# Patient Record
Sex: Female | Born: 1975 | Hispanic: Yes | Marital: Single | Smoking: Never smoker
Health system: Southern US, Community
[De-identification: ages and names within clinical notes are randomized; demographics above are authoritative.]

## PROBLEM LIST (undated history)

## (undated) DIAGNOSIS — F329 Major depressive disorder, single episode, unspecified: Secondary | ICD-10-CM

## (undated) DIAGNOSIS — F32A Depression, unspecified: Secondary | ICD-10-CM

## (undated) DIAGNOSIS — Z789 Other specified health status: Secondary | ICD-10-CM

## (undated) DIAGNOSIS — R42 Dizziness and giddiness: Secondary | ICD-10-CM

## (undated) DIAGNOSIS — F99 Mental disorder, not otherwise specified: Secondary | ICD-10-CM

## (undated) DIAGNOSIS — E7800 Pure hypercholesterolemia, unspecified: Secondary | ICD-10-CM

## (undated) DIAGNOSIS — O24419 Gestational diabetes mellitus in pregnancy, unspecified control: Secondary | ICD-10-CM

## (undated) HISTORY — DX: Dizziness and giddiness: R42

## (undated) HISTORY — PX: CHOLECYSTECTOMY: SHX55

## (undated) HISTORY — PX: NO PAST SURGERIES: SHX2092

---

## 2011-07-18 ENCOUNTER — Encounter: Payer: Self-pay | Admitting: *Deleted

## 2011-10-24 ENCOUNTER — Other Ambulatory Visit (HOSPITAL_COMMUNITY): Payer: Self-pay | Admitting: Physician Assistant

## 2011-10-24 DIAGNOSIS — Z3689 Encounter for other specified antenatal screening: Secondary | ICD-10-CM

## 2011-10-24 LAB — HEMOGLOBINOPATHY EVALUATION: Hemoglobin Evaluation: NORMAL

## 2011-10-24 LAB — OB RESULTS CONSOLE ANTIBODY SCREEN: Antibody Screen: NEGATIVE

## 2011-11-11 ENCOUNTER — Encounter (HOSPITAL_COMMUNITY): Payer: Self-pay

## 2011-11-11 ENCOUNTER — Ambulatory Visit (HOSPITAL_COMMUNITY)
Admission: RE | Admit: 2011-11-11 | Discharge: 2011-11-11 | Disposition: A | Discharge status: Home / Self Care | Payer: Medicaid Other | Source: Ambulatory Visit | Attending: Physician Assistant | Admitting: Physician Assistant

## 2011-11-11 DIAGNOSIS — Z3689 Encounter for other specified antenatal screening: Secondary | ICD-10-CM | POA: Insufficient documentation

## 2012-03-22 ENCOUNTER — Encounter (HOSPITAL_COMMUNITY): Payer: Self-pay | Admitting: *Deleted

## 2012-03-23 ENCOUNTER — Encounter (HOSPITAL_COMMUNITY): Payer: Self-pay | Admitting: *Deleted

## 2012-03-23 ENCOUNTER — Inpatient Hospital Stay (HOSPITAL_COMMUNITY): Payer: Medicaid Other | Admitting: Anesthesiology

## 2012-03-23 ENCOUNTER — Encounter (HOSPITAL_COMMUNITY)
Admission: RE | Disposition: A | Discharge status: Home / Self Care | Payer: Self-pay | Source: Ambulatory Visit | Attending: Obstetrics and Gynecology

## 2012-03-23 ENCOUNTER — Encounter (HOSPITAL_COMMUNITY): Payer: Self-pay | Admitting: Anesthesiology

## 2012-03-23 ENCOUNTER — Inpatient Hospital Stay (HOSPITAL_COMMUNITY)
Admission: RE | Admit: 2012-03-23 | Discharge: 2012-03-25 | DRG: 766 | Disposition: A | Discharge status: Home / Self Care | Payer: Medicaid Other | Source: Ambulatory Visit | Attending: Obstetrics and Gynecology | Admitting: Obstetrics and Gynecology

## 2012-03-23 ENCOUNTER — Other Ambulatory Visit: Payer: Self-pay | Admitting: Obstetrics & Gynecology

## 2012-03-23 DIAGNOSIS — Z98891 History of uterine scar from previous surgery: Secondary | ICD-10-CM

## 2012-03-23 DIAGNOSIS — O34219 Maternal care for unspecified type scar from previous cesarean delivery: Secondary | ICD-10-CM

## 2012-03-23 DIAGNOSIS — O09529 Supervision of elderly multigravida, unspecified trimester: Secondary | ICD-10-CM

## 2012-03-23 HISTORY — DX: Mental disorder, not otherwise specified: F99

## 2012-03-23 HISTORY — DX: Other specified health status: Z78.9

## 2012-03-23 LAB — CBC
Hemoglobin: 13.2 g/dL (ref 12.0–15.0)
MCH: 29.2 pg (ref 26.0–34.0)
MCV: 87.4 fL (ref 78.0–100.0)
RBC: 4.52 MIL/uL (ref 3.87–5.11)

## 2012-03-23 LAB — RPR: RPR Ser Ql: NONREACTIVE

## 2012-03-23 SURGERY — Surgical Case
Anesthesia: Spinal | Site: Abdomen | Wound class: Clean Contaminated

## 2012-03-23 MED ORDER — IBUPROFEN 600 MG PO TABS: 600.0000 mg | ORAL_TABLET | Freq: Four times a day (QID) | ORAL | Status: DC | PRN

## 2012-03-23 MED ORDER — ZOLPIDEM TARTRATE 5 MG PO TABS: 5.0000 mg | ORAL_TABLET | Freq: Every evening | ORAL | Status: DC | PRN

## 2012-03-23 MED ORDER — OXYTOCIN 10 UNIT/ML IJ SOLN
40.0000 [IU] | INTRAVENOUS | Status: DC | PRN
  Administered 2012-03-23: 40 [IU] via INTRAVENOUS

## 2012-03-23 MED ORDER — NALBUPHINE HCL 10 MG/ML IJ SOLN
5.0000 mg | INTRAMUSCULAR | Status: DC | PRN
  Filled 2012-03-23: qty 1

## 2012-03-23 MED ORDER — METOCLOPRAMIDE HCL 5 MG/ML IJ SOLN: 10.0000 mg | Freq: Three times a day (TID) | INTRAMUSCULAR | Status: DC | PRN

## 2012-03-23 MED ORDER — NALBUPHINE HCL 10 MG/ML IJ SOLN
5.0000 mg | INTRAMUSCULAR | Status: DC | PRN
  Administered 2012-03-23: 5 mg via SUBCUTANEOUS
  Filled 2012-03-23: qty 1

## 2012-03-23 MED ORDER — 0.9 % SODIUM CHLORIDE (POUR BTL) OPTIME
TOPICAL | Status: DC | PRN
  Administered 2012-03-23: 1000 mL

## 2012-03-23 MED ORDER — HYDROMORPHONE HCL PF 1 MG/ML IJ SOLN
INTRAMUSCULAR | Status: AC
  Administered 2012-03-23: 0.5 mg via INTRAVENOUS
  Filled 2012-03-23: qty 1

## 2012-03-23 MED ORDER — PRENATAL MULTIVITAMIN CH
1.0000 | ORAL_TABLET | Freq: Every day | ORAL | Status: DC
  Administered 2012-03-24 – 2012-03-25 (×2): 1 via ORAL
  Filled 2012-03-23 (×2): qty 1

## 2012-03-23 MED ORDER — TETANUS-DIPHTH-ACELL PERTUSSIS 5-2.5-18.5 LF-MCG/0.5 IM SUSP: 0.5000 mL | Freq: Once | INTRAMUSCULAR | Status: DC

## 2012-03-23 MED ORDER — MEPERIDINE HCL 25 MG/ML IJ SOLN: 6.2500 mg | INTRAMUSCULAR | Status: DC | PRN

## 2012-03-23 MED ORDER — SODIUM CHLORIDE 0.9 % IJ SOLN: 3.0000 mL | INTRAMUSCULAR | Status: DC | PRN

## 2012-03-23 MED ORDER — DIPHENHYDRAMINE HCL 25 MG PO CAPS: 25.0000 mg | ORAL_CAPSULE | Freq: Four times a day (QID) | ORAL | Status: DC | PRN

## 2012-03-23 MED ORDER — SIMETHICONE 80 MG PO CHEW: 80.0000 mg | CHEWABLE_TABLET | Freq: Three times a day (TID) | ORAL | Status: DC

## 2012-03-23 MED ORDER — FENTANYL CITRATE 0.05 MG/ML IJ SOLN
INTRAMUSCULAR | Status: DC | PRN
  Administered 2012-03-23: 25 ug via INTRAVENOUS

## 2012-03-23 MED ORDER — CEFAZOLIN SODIUM-DEXTROSE 2-3 GM-% IV SOLR
INTRAVENOUS | Status: DC | PRN
  Administered 2012-03-23: 2 g via INTRAVENOUS

## 2012-03-23 MED ORDER — DIPHENHYDRAMINE HCL 25 MG PO CAPS: 25.0000 mg | ORAL_CAPSULE | ORAL | Status: DC | PRN

## 2012-03-23 MED ORDER — ONDANSETRON HCL 4 MG PO TABS: 4.0000 mg | ORAL_TABLET | ORAL | Status: DC | PRN

## 2012-03-23 MED ORDER — OXYTOCIN 40 UNITS IN LACTATED RINGERS INFUSION - SIMPLE MED: 62.5000 mL/h | INTRAVENOUS | Status: DC

## 2012-03-23 MED ORDER — ONDANSETRON HCL 4 MG/2ML IJ SOLN: 4.0000 mg | INTRAMUSCULAR | Status: DC | PRN

## 2012-03-23 MED ORDER — KETOROLAC TROMETHAMINE 60 MG/2ML IM SOLN
60.0000 mg | Freq: Once | INTRAMUSCULAR | Status: AC | PRN
  Administered 2012-03-23: 60 mg via INTRAMUSCULAR

## 2012-03-23 MED ORDER — OXYCODONE-ACETAMINOPHEN 5-325 MG PO TABS: 1.0000 | ORAL_TABLET | ORAL | Status: DC | PRN

## 2012-03-23 MED ORDER — DIBUCAINE 1 % RE OINT: 1.0000 "application " | TOPICAL_OINTMENT | RECTAL | Status: DC | PRN

## 2012-03-23 MED ORDER — MORPHINE SULFATE 0.5 MG/ML IJ SOLN
INTRAMUSCULAR | Status: AC
  Filled 2012-03-23: qty 10

## 2012-03-23 MED ORDER — LACTATED RINGERS IV SOLN: INTRAVENOUS | Status: DC

## 2012-03-23 MED ORDER — HYDROMORPHONE HCL PF 1 MG/ML IJ SOLN
0.2500 mg | INTRAMUSCULAR | Status: DC | PRN
  Administered 2012-03-23 (×2): 0.5 mg via INTRAVENOUS

## 2012-03-23 MED ORDER — ONDANSETRON HCL 4 MG/2ML IJ SOLN
INTRAMUSCULAR | Status: DC | PRN
  Administered 2012-03-23: 4 mg via INTRAVENOUS

## 2012-03-23 MED ORDER — LANOLIN HYDROUS EX OINT: 1.0000 "application " | TOPICAL_OINTMENT | CUTANEOUS | Status: DC | PRN

## 2012-03-23 MED ORDER — SCOPOLAMINE 1 MG/3DAYS TD PT72
1.0000 | MEDICATED_PATCH | Freq: Once | TRANSDERMAL | Status: DC
Start: 2012-03-23 — End: 2012-03-23
  Administered 2012-03-23: 1.5 mg via TRANSDERMAL

## 2012-03-23 MED ORDER — LACTATED RINGERS IV SOLN
INTRAVENOUS | Status: DC
  Administered 2012-03-23: 16:00:00 via INTRAVENOUS

## 2012-03-23 MED ORDER — WITCH HAZEL-GLYCERIN EX PADS: 1.0000 "application " | MEDICATED_PAD | CUTANEOUS | Status: DC | PRN

## 2012-03-23 MED ORDER — CEFAZOLIN SODIUM-DEXTROSE 2-3 GM-% IV SOLR
INTRAVENOUS | Status: AC
  Filled 2012-03-23: qty 50

## 2012-03-23 MED ORDER — PHENYLEPHRINE 40 MCG/ML (10ML) SYRINGE FOR IV PUSH (FOR BLOOD PRESSURE SUPPORT)
PREFILLED_SYRINGE | INTRAVENOUS | Status: AC
  Filled 2012-03-23: qty 15

## 2012-03-23 MED ORDER — OXYCODONE-ACETAMINOPHEN 5-325 MG PO TABS
1.0000 | ORAL_TABLET | ORAL | Status: DC | PRN
  Administered 2012-03-24 – 2012-03-25 (×2): 1 via ORAL
  Filled 2012-03-23 (×2): qty 1

## 2012-03-23 MED ORDER — IBUPROFEN 600 MG PO TABS
600.0000 mg | ORAL_TABLET | Freq: Four times a day (QID) | ORAL | Status: DC
  Administered 2012-03-24 – 2012-03-25 (×6): 600 mg via ORAL
  Filled 2012-03-23 (×6): qty 1

## 2012-03-23 MED ORDER — MORPHINE SULFATE (PF) 0.5 MG/ML IJ SOLN
INTRAMUSCULAR | Status: DC | PRN
  Administered 2012-03-23: .15 mg via EPIDURAL

## 2012-03-23 MED ORDER — IBUPROFEN 600 MG PO TABS: 600.0000 mg | ORAL_TABLET | Freq: Four times a day (QID) | ORAL | Status: DC

## 2012-03-23 MED ORDER — KETOROLAC TROMETHAMINE 30 MG/ML IJ SOLN: 30.0000 mg | Freq: Four times a day (QID) | INTRAMUSCULAR | Status: AC | PRN

## 2012-03-23 MED ORDER — OXYTOCIN 10 UNIT/ML IJ SOLN
INTRAMUSCULAR | Status: AC
  Filled 2012-03-23: qty 4

## 2012-03-23 MED ORDER — NALOXONE HCL 0.4 MG/ML IJ SOLN: 0.4000 mg | INTRAMUSCULAR | Status: DC | PRN

## 2012-03-23 MED ORDER — CEFAZOLIN SODIUM-DEXTROSE 2-3 GM-% IV SOLR: 2.0000 g | INTRAVENOUS | Status: DC

## 2012-03-23 MED ORDER — DIPHENHYDRAMINE HCL 50 MG/ML IJ SOLN
12.5000 mg | INTRAMUSCULAR | Status: DC | PRN
  Administered 2012-03-24: 12.5 mg via INTRAVENOUS
  Filled 2012-03-23: qty 1

## 2012-03-23 MED ORDER — SENNOSIDES-DOCUSATE SODIUM 8.6-50 MG PO TABS
2.0000 | ORAL_TABLET | Freq: Every day | ORAL | Status: DC
  Administered 2012-03-23 – 2012-03-24 (×2): 2 via ORAL

## 2012-03-23 MED ORDER — SIMETHICONE 80 MG PO CHEW: 80.0000 mg | CHEWABLE_TABLET | ORAL | Status: DC | PRN

## 2012-03-23 MED ORDER — MENTHOL 3 MG MT LOZG: 1.0000 | LOZENGE | OROMUCOSAL | Status: DC | PRN

## 2012-03-23 MED ORDER — KETOROLAC TROMETHAMINE 60 MG/2ML IM SOLN
INTRAMUSCULAR | Status: AC
  Filled 2012-03-23: qty 2

## 2012-03-23 MED ORDER — DIPHENHYDRAMINE HCL 50 MG/ML IJ SOLN: 25.0000 mg | INTRAMUSCULAR | Status: DC | PRN

## 2012-03-23 MED ORDER — OXYTOCIN 40 UNITS IN LACTATED RINGERS INFUSION - SIMPLE MED: 62.5000 mL/h | INTRAVENOUS | Status: AC

## 2012-03-23 MED ORDER — ONDANSETRON HCL 4 MG/2ML IJ SOLN: 4.0000 mg | Freq: Three times a day (TID) | INTRAMUSCULAR | Status: DC | PRN

## 2012-03-23 MED ORDER — BUPIVACAINE IN DEXTROSE 0.75-8.25 % IT SOLN
INTRATHECAL | Status: DC | PRN
  Administered 2012-03-23: 1.2 mL via INTRATHECAL

## 2012-03-23 MED ORDER — SCOPOLAMINE 1 MG/3DAYS TD PT72: 1.0000 | MEDICATED_PATCH | Freq: Once | TRANSDERMAL | Status: DC

## 2012-03-23 MED ORDER — NALOXONE HCL 1 MG/ML IJ SOLN: 1.0000 ug/kg/h | INTRAVENOUS | Status: DC | PRN

## 2012-03-23 MED ORDER — PHENYLEPHRINE 40 MCG/ML (10ML) SYRINGE FOR IV PUSH (FOR BLOOD PRESSURE SUPPORT)
PREFILLED_SYRINGE | INTRAVENOUS | Status: AC
  Filled 2012-03-23: qty 5

## 2012-03-23 MED ORDER — SENNOSIDES-DOCUSATE SODIUM 8.6-50 MG PO TABS: 2.0000 | ORAL_TABLET | Freq: Every day | ORAL | Status: DC

## 2012-03-23 MED ORDER — SIMETHICONE 80 MG PO CHEW
80.0000 mg | CHEWABLE_TABLET | Freq: Three times a day (TID) | ORAL | Status: DC
  Administered 2012-03-23 – 2012-03-25 (×4): 80 mg via ORAL

## 2012-03-23 MED ORDER — FENTANYL CITRATE 0.05 MG/ML IJ SOLN
INTRAMUSCULAR | Status: AC
  Filled 2012-03-23: qty 2

## 2012-03-23 MED ORDER — SCOPOLAMINE 1 MG/3DAYS TD PT72
MEDICATED_PATCH | TRANSDERMAL | Status: AC
  Filled 2012-03-23: qty 1

## 2012-03-23 MED ORDER — MEPERIDINE HCL 25 MG/ML IJ SOLN
INTRAMUSCULAR | Status: AC
  Filled 2012-03-23: qty 1

## 2012-03-23 MED ORDER — LACTATED RINGERS IV SOLN
Freq: Once | INTRAVENOUS | Status: AC
  Administered 2012-03-23: 15:00:00 via INTRAVENOUS

## 2012-03-23 MED ORDER — PRENATAL MULTIVITAMIN CH: 1.0000 | ORAL_TABLET | Freq: Every day | ORAL | Status: DC

## 2012-03-23 MED ORDER — LACTATED RINGERS IV SOLN
INTRAVENOUS | Status: DC | PRN
  Administered 2012-03-23: 17:00:00 via INTRAVENOUS

## 2012-03-23 MED ORDER — LACTATED RINGERS IV SOLN
INTRAVENOUS | Status: DC | PRN
  Administered 2012-03-23 (×3): via INTRAVENOUS

## 2012-03-23 MED ORDER — NALBUPHINE SYRINGE 5 MG/0.5 ML
INJECTION | INTRAMUSCULAR | Status: AC
  Filled 2012-03-23: qty 0.5

## 2012-03-23 MED ORDER — LACTATED RINGERS IV SOLN
INTRAVENOUS | Status: DC
  Administered 2012-03-23 – 2012-03-24 (×2): via INTRAVENOUS

## 2012-03-23 MED ORDER — ONDANSETRON HCL 4 MG/2ML IJ SOLN
INTRAMUSCULAR | Status: AC
  Filled 2012-03-23: qty 2

## 2012-03-23 MED ORDER — PHENYLEPHRINE HCL 10 MG/ML IJ SOLN
INTRAMUSCULAR | Status: DC | PRN
  Administered 2012-03-23 (×4): 80 ug via INTRAVENOUS
  Administered 2012-03-23 (×2): 40 ug via INTRAVENOUS
  Administered 2012-03-23 (×2): 80 ug via INTRAVENOUS
  Administered 2012-03-23 (×2): 40 ug via INTRAVENOUS
  Administered 2012-03-23 (×2): 80 ug via INTRAVENOUS
  Administered 2012-03-23: 40 ug via INTRAVENOUS
  Administered 2012-03-23: 80 ug via INTRAVENOUS

## 2012-03-23 MED ORDER — SCOPOLAMINE 1 MG/3DAYS TD PT72
MEDICATED_PATCH | TRANSDERMAL | Status: AC
  Administered 2012-03-23: 1.5 mg via TRANSDERMAL
  Filled 2012-03-23: qty 1

## 2012-03-23 SURGICAL SUPPLY — 36 items
BENZOIN TINCTURE PRP APPL 2/3 (GAUZE/BANDAGES/DRESSINGS) IMPLANT
CLOTH BEACON ORANGE TIMEOUT ST (SAFETY) ×2 IMPLANT
DRAPE LG THREE QUARTER DISP (DRAPES) ×2 IMPLANT
DRSG OPSITE POSTOP 4X10 (GAUZE/BANDAGES/DRESSINGS) ×2 IMPLANT
DURAPREP 26ML APPLICATOR (WOUND CARE) ×2 IMPLANT
ELECT REM PT RETURN 9FT ADLT (ELECTROSURGICAL) ×2
ELECTRODE REM PT RTRN 9FT ADLT (ELECTROSURGICAL) ×1 IMPLANT
EXTRACTOR VACUUM KIWI (MISCELLANEOUS) IMPLANT
GLOVE BIO SURGEON ST LM GN SZ9 (GLOVE) ×2 IMPLANT
GLOVE BIOGEL PI IND STRL 9 (GLOVE) ×2 IMPLANT
GLOVE BIOGEL PI INDICATOR 9 (GLOVE) ×2
GOWN PREVENTION PLUS LG XLONG (DISPOSABLE) ×2 IMPLANT
GOWN PREVENTION PLUS XXLARGE (GOWN DISPOSABLE) ×2 IMPLANT
GOWN STRL REIN 3XL LVL4 (GOWN DISPOSABLE) ×2 IMPLANT
NEEDLE HYPO 25X5/8 SAFETYGLIDE (NEEDLE) IMPLANT
NS IRRIG 1000ML POUR BTL (IV SOLUTION) ×2 IMPLANT
PACK C SECTION WH (CUSTOM PROCEDURE TRAY) ×2 IMPLANT
PAD OB MATERNITY 4.3X12.25 (PERSONAL CARE ITEMS) IMPLANT
RETRACTOR WND ALEXIS 25 LRG (MISCELLANEOUS) IMPLANT
RTRCTR C-SECT PINK 25CM LRG (MISCELLANEOUS) IMPLANT
RTRCTR WOUND ALEXIS 25CM LRG (MISCELLANEOUS)
SLEEVE SCD COMPRESS KNEE MED (MISCELLANEOUS) ×2 IMPLANT
SPONGE LAP 18X18 X RAY DECT (DISPOSABLE) ×2 IMPLANT
STAPLER VISISTAT 35W (STAPLE) ×2 IMPLANT
STRIP CLOSURE SKIN 1/2X4 (GAUZE/BANDAGES/DRESSINGS) IMPLANT
SUT CHROMIC 0 CTX 36 (SUTURE) ×4 IMPLANT
SUT PDS AB 0 CT 36 (SUTURE) ×4 IMPLANT
SUT VIC AB 0 CT1 27 (SUTURE) ×1
SUT VIC AB 0 CT1 27XBRD ANBCTR (SUTURE) ×1 IMPLANT
SUT VIC AB 2-0 CT1 27 (SUTURE) ×2
SUT VIC AB 2-0 CT1 TAPERPNT 27 (SUTURE) ×2 IMPLANT
SUT VIC AB 4-0 KS 27 (SUTURE) ×2 IMPLANT
SYR BULB IRRIGATION 50ML (SYRINGE) IMPLANT
TOWEL OR 17X24 6PK STRL BLUE (TOWEL DISPOSABLE) ×2 IMPLANT
TRAY FOLEY CATH 14FR (SET/KITS/TRAYS/PACK) ×2 IMPLANT
WATER STERILE IRR 1000ML POUR (IV SOLUTION) ×2 IMPLANT

## 2012-03-23 NOTE — Anesthesia Preprocedure Evaluation (Signed)

## 2012-03-23 NOTE — Op Note (Signed)
See operative note details included in brief operative note

## 2012-03-23 NOTE — Brief Op Note (Signed)
03/23/2012  6:14 PM  PATIENT:  Victoria Anthony  36 y.o. female  PRE-OPERATIVE DIAGNOSIS:  Repeat c/s x's 3, pregnancy 39 weeks  POST-OPERATIVE DIAGNOSIS:  Previous Cesarean Section times four. Prenancy 39 weeks, delivered  PROCEDURE:  Procedure(s) (LRB) with comments: CESAREAN SECTION (N/A) repeat low transverse, through a midline lower abdominal wall incision  SURGEON:  Surgeon(s) and Role:    * Tilda Burrow, MD - Primary  PHYSICIAN ASSISTANT: Truitt Merle MD  ASSISTANTS: none   ANESTHESIA:   epidural and spinal  EBL:  Total I/O In: 3400 [I.V.:3400] Out: 1100 [Urine:400; Blood:700]  BLOOD ADMINISTERED:none  DRAINS: Urinary Catheter (Foley)   LOCAL MEDICATIONS USED:  NONE  SPECIMEN:  Source of Specimen:  lacenta to labor and delivery  DISPOSITION OF SPECIMEN:  PATHOLOGY  COUNTS:  YES  TOURNIQUET:  * No tourniquets in log *  DICTATION: .Dragon Dictation Patient was taken to the operating room prepped and draped for lower surgery. Spinal anesthesia was initiated, with epidural catheter placed to allow for extra time if necessary.. All consents were confirmed through translators prior to the procedure. Timeout was conducted with surgical team confirming the procedure, Ancef 2 g IV administered. 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 It should be noted that the incision was at a bit of a oblique angle, running from the left side of the umbilicus to the right side of the midline at the level of the symphysis pubis. Incision was opened such that adequate access to the pelvis could be identified. Bladder flap was not developed. A firm fibrotic tissue above the bladder and site of prior hysterotomy incisions were used to enter the uterine cavity, with transverse extension of the incision using index finger traction. The vertex was rotated into the incision and delivered with fundal pressure. There was significant amount of  bleeding from the left end of the uterine incision, so pressure was used in this area. Placenta was delivered intact [facility] presentation. Membrane remnants from and the anterior lower uterine segment area was then extracted and the uterus irrigated. A running locking first layer using 0 chromic was performed from side to side. Prior to placing this running locking suture a figure-of-eight suture was placed on the left side of the incision at the angle for hemostasis. The second layer was a continuous running suture of 0 chromic and there was good hemostasis. A 0 gated generously with saline solution the anterior peritoneum was then inspected and then the anterior wall closed in a single layer closure using PDS 0, beginning at each end and sewing to the midportion of the incision. Subcutaneous tissues were released sufficiently to pull the subcutaneous fat together in the midline a continuous running 2-0 Vicryl suture. This allowed for good skin edge approximation and staples were used to close the incision. Sponge and needle counts were correct throughout estimated blood loss 650 cc condition to recovery in good..  Of note, the patient had not signed tubal sterilization forms prior to the procedure so no sterilization was performed.  PLAN OF CARE: Admit to inpatient   PATIENT DISPOSITION:  PACU - hemodynamically stable.   Delay start of Pharmacological VTE agent (>24hrs) due to surgical blood loss or risk of bleeding: not applicable

## 2012-03-23 NOTE — Preoperative (Signed)
Beta Blockers   Reason not to administer Beta Blockers:Not Applicable 

## 2012-03-23 NOTE — H&P (Signed)
Victoria Anthony is a 36 y.o. female [phone] at [redacted]w[redacted]d based on [facility] of 03/28/2012 consistent with second trimester ultrasound presenting for scheduled repeat cesarean section. History OB History    Grav Para Term Preterm Abortions TAB SAB Ect Mult Living   4 3 2 1      3      Past Medical History  Diagnosis Date  . Chest pain   h/o postpartum depression s/p last delivery- hospitalized x 2 days at [facility]   Past surgical history: cesarean section x 3; gall bladder removal Family History: family history includes Stomach cancer in an unspecified family member. Social History:  reports that she has never smoked. She does not have any smokeless tobacco history on file. She reports that she does not drink alcohol or use illicit drugs.   Prenatal Transfer Tool  Maternal Diabetes: No Genetic Screening: Normal Maternal Ultrasounds/Referrals: Normal Fetal Ultrasounds or other Referrals:  None Maternal Substance Abuse:  No Significant Maternal Medications:  None Significant Maternal Lab Results:  None Other Comments:  None  Review of Systems  All other systems reviewed and are negative.      Last menstrual period 06/29/2011. Exam Physical Exam  GENERAL: Well-developed, well-nourished female in no acute distress.  HEENT: Normocephalic, atraumatic. Sclerae anicteric.  NECK: Supple. Normal thyroid.  LUNGS: Clear to auscultation bilaterally.  HEART: Regular rate and rhythm. BREASTS: Symmetric in size. No palpable masses or lymphadenopathy, skin changes, or nipple drainage. ABDOMEN: Soft, nontender, gravid. PELVIC: Not indicated EXTREMITIES: No cyanosis, clubbing, or edema, 2+ distal pulses.  Prenatal labs: ABO, Rh: O/Negative/-- (07/15 0000) Antibody: Negative (07/15 0000) Rubella: Immune (07/15 0000) RPR: Nonreactive (07/15 0000)  HBsAg: Negative (07/15 0000)  HIV: Non-reactive (07/15 0000)  GBS:   Negative (02/29/2012)  Assessment/Plan: 36 yo G4P2103 at [redacted]w[redacted]d for scheduled  repeat cesarean section - Risks, benefits and alternatives explained including but not limited to risks of bleeding, infection and damage to adjacent organs. Patient verbalized understanding and all questions were answered.  - Patient opted not to have bilateral tubal ligation for contraception but rather is planning on using IUD  Victoria Anthony 03/23/2012, 3:02 PM

## 2012-03-23 NOTE — Anesthesia Procedure Notes (Signed)
Spinal  Patient location during procedure: OR Preanesthetic Checklist Completed: patient identified, site marked, surgical consent, pre-op evaluation, timeout performed, IV checked, risks and benefits discussed and monitors and equipment checked Spinal Block Patient position: sitting Prep: DuraPrep Patient monitoring: heart rate, cardiac monitor, continuous pulse ox and blood pressure Approach: midline Location: L3-4 Injection technique: single-shot Needle Needle type: Sprotte  Needle gauge: 24 G Needle length: 9 cm Assessment Sensory level: T4 Additional Notes Spinal Dosage in OR  Bupivicaine ml       1.2 PFMS04   mcg        150  Fentanyl mcg            25  LOR at 4.5 cm with tuohy. 120 mm sprotte. Catheter to 10 cm at skin w/o parasthesia......  (-) asp heme/CSF

## 2012-03-23 NOTE — Anesthesia Postprocedure Evaluation (Signed)
  Anesthesia Post-op Note  Patient: Victoria Anthony  Procedure(s) Performed: Procedure(s) (LRB) with comments: CESAREAN SECTION (N/A)  Patient Location: PACU  Anesthesia Type:Spinal  Level of Consciousness: awake, alert  and oriented  Airway and Oxygen Therapy: Patient Spontanous Breathing  Post-op Pain: none  Post-op Assessment: Post-op Vital signs reviewed, Patient's Cardiovascular Status Stable, Respiratory Function Stable, Patent Airway, No signs of Nausea or vomiting, Pain level controlled, No headache, No backache, No residual numbness and No residual motor weakness  Post-op Vital Signs: Reviewed and stable  Complications: No apparent anesthesia complications

## 2012-03-23 NOTE — Transfer of Care (Signed)
Immediate Anesthesia Transfer of Care Note  Patient: Victoria Anthony  Procedure(s) Performed: Procedure(s) (LRB) with comments: CESAREAN SECTION (N/A)  Patient Location: PACU  Anesthesia Type:Spinal  Level of Consciousness: awake, alert  and oriented  Airway & Oxygen Therapy: Patient Spontanous Breathing  Post-op Assessment: Report given to PACU RN  Post vital signs: Reviewed and stable  Complications: No apparent anesthesia complications

## 2012-03-24 LAB — CBC
Hemoglobin: 10.3 g/dL — ABNORMAL LOW (ref 12.0–15.0)
RBC: 3.49 MIL/uL — ABNORMAL LOW (ref 3.87–5.11)
WBC: 8.8 10*3/uL (ref 4.0–10.5)

## 2012-03-24 NOTE — Anesthesia Postprocedure Evaluation (Signed)
  Anesthesia Post-op Note  Patient: Victoria Anthony  Procedure(s) Performed: Procedure(s) (LRB) with comments: CESAREAN SECTION (N/A)  Patient Location: Mother/Baby  Anesthesia Type:Spinal and Epidural  Level of Consciousness: awake, alert  and oriented  Airway and Oxygen Therapy: Patient Spontanous Breathing  Post-op Pain: mild  Post-op Assessment: Patient's Cardiovascular Status Stable, Respiratory Function Stable, Patent Airway, No signs of Nausea or vomiting and Pain level controlled  Post-op Vital Signs: stable  Complications: No apparent anesthesia complications

## 2012-03-24 NOTE — Clinical Social Work Note (Signed)
CSW spoke with MOB with interpreter in room.  MOB reports she was taking medication (MOB cannot recall name) for depression before pregnancy and plans to restart this as it was very effective.  She was prescribed medication by [facility].  She reports no current concerns and feels comfortable calling in refill for medication.   Patient was referred for history of depression/anxiety. * Referral screened out by Clinical Social Worker because none of the following criteria appear to apply:  ~ History of anxiety/depression during this pregnancy, or of post-partum depression. ~ Diagnosis of anxiety and/or depression within last 3 years ~ History of depression due to pregnancy loss/loss of child  OR  * Patient's symptoms currently being treated with medication and/or therapy.  Please contact the Clinical Social Worker if needs arise, or by the patient's request.  [phone] 

## 2012-03-24 NOTE — Progress Notes (Addendum)
Subjective: Postpartum Day 1: Cesarean Delivery Eating, drinking, ambulating well. No flatus yet.  Lochia and pain wnl.  No complaints.  Exam in presence of spanish interpreter.     Objective: Vital signs in last 24 hours: Temp:  [98.1 F (36.7 C)-99.4 F (37.4 C)] 98.6 F (37 C) (12/14 0528) Pulse Rate:  [80-115] 82  (12/14 0528) Resp:  [11-24] 16  (12/14 0528) BP: (92-121)/(49-78) 95/55 mmHg (12/14 0528) SpO2:  [93 %-100 %] 95 % (12/14 0528) Weight:  [60.782 kg (134 lb)] 60.782 kg (134 lb) (12/13 1501)  Physical Exam:  General: alert, cooperative and no distress Lochia: appropriate Uterine Fundus: firm Incision: healing well, no significant drainage, no dehiscence, no significant erythema DVT Evaluation: No evidence of DVT seen on physical exam. Negative Homan's sign. No cords or calf tenderness. No significant calf/ankle edema.   Basename 03/24/12 0533 03/23/12 1434  HGB 10.3* 13.2  HCT 30.7* 39.5    Assessment/Plan: Status post Cesarean section. Doing well postoperatively.  Continue current care.  H/O PPD requiring hospitalization- SW consult pending. Breastfeeding, desires paragard for contraception.  Marge Duncans 03/24/2012, 8:34 AM

## 2012-03-25 DIAGNOSIS — Z98891 History of uterine scar from previous surgery: Secondary | ICD-10-CM

## 2012-03-25 MED ORDER — OXYCODONE-ACETAMINOPHEN 5-325 MG PO TABS: 1.0000 | ORAL_TABLET | ORAL | Status: DC | PRN

## 2012-03-25 MED ORDER — IBUPROFEN 600 MG PO TABS: 600.0000 mg | ORAL_TABLET | Freq: Four times a day (QID) | ORAL | Status: DC

## 2012-03-25 NOTE — Discharge Summary (Signed)
Attestation of Attending Supervision of Advanced Practitioner (PA/CNM/NP): Evaluation and management procedures were performed by the Advanced Practitioner under my supervision and collaboration.  I have reviewed the Advanced Practitioner's note and chart, and I agree with the management and plan.  Bev Drennen, MD, FACOG Attending Obstetrician & Gynecologist Faculty Practice, [facility]  

## 2012-03-25 NOTE — Discharge Summary (Signed)
Obstetric Discharge Summary Reason for Admission: cesarean section, repeat/scheduled Prenatal Procedures: none Intrapartum Procedures: cesarean: low cervical, transverse through MIDLINE abdominal incision Postpartum Procedures: none Complications-Operative and Postpartum: none Hemoglobin  Date Value Range Status  03/24/2012 10.3* 12.0 - 15.0 g/dL Final     DELTA CHECK NOTED     REPEATED TO VERIFY     HCT  Date Value Range Status  03/24/2012 30.7* 36.0 - 46.0 % Final    Physical Exam:  General: alert, cooperative and no distress Lochia: appropriate Uterine Fundus: firm Incision: healing well, no significant drainage, no dehiscence DVT Evaluation: No evidence of DVT seen on physical exam.  Discharge Diagnoses: Term Pregnancy-delivered by c-section  Discharge Information: Date: 03/25/2012 Activity: pelvic rest Diet: routine Medications: Ibuprofen and Percocet Condition: stable Instructions: refer to practice specific booklet Discharge to: home Follow-up Information    Follow up with [facility] [place]. In 4 weeks.   Contact information:   [address] [place] [address] [phone]         Newborn Data: Live born female  Birth Weight: 7 lb 3.9 oz (3286 g) APGAR: 9, 9  Home with mother. Breast and bottle feeding. Planning copper IUD for contraception.  Street, Christopher 03/25/2012, 7:43 AM  Seen and examined Agree with note Wynelle Bourgeois CNM

## 2012-03-26 ENCOUNTER — Encounter (HOSPITAL_COMMUNITY): Payer: Self-pay | Admitting: Obstetrics and Gynecology

## 2012-03-26 LAB — TYPE AND SCREEN
ABO/RH(D): O NEG
Antibody Screen: POSITIVE
Unit division: 0

## 2012-03-26 NOTE — Progress Notes (Signed)
Post discharge chart review completed.  

## 2012-03-28 ENCOUNTER — Encounter (HOSPITAL_COMMUNITY): Payer: Self-pay

## 2012-03-28 ENCOUNTER — Inpatient Hospital Stay (HOSPITAL_COMMUNITY)
Admission: AD | Admit: 2012-03-28 | Discharge: 2012-03-28 | Disposition: A | Discharge status: Home / Self Care | Payer: Self-pay | Source: Ambulatory Visit | Attending: Obstetrics & Gynecology | Admitting: Obstetrics & Gynecology

## 2012-03-28 DIAGNOSIS — Z4802 Encounter for removal of sutures: Secondary | ICD-10-CM | POA: Insufficient documentation

## 2012-03-28 NOTE — MAU Note (Signed)
Patient is a week postpartum. She states that a baby nurse came to her house today to check her BP. She was told to come to MAU to have her staples removed (she had a c-section). She denies any discomfort or any problems.

## 2012-03-28 NOTE — MAU Provider Note (Signed)
S: Patient presents for staple removal. She delivered 1 week ago and was told by her OB (unsure of name, patient receive PNC at [facility]) and the home health nurse that she could come here to have the staples removed. She is unable to go to the [facility] office for removal because of transportation issues. Patient also states that she was told to have her BP checked when she came to MAU because it has been low.   O: BP:140/62 Vertical incision from below umbilicus to the pubic bone. Incision well healed. Small section of open area with minimal bleeding 2/3 of the way down the incision line.   Very minimal bleeding at opening. Otherwise incision is very well healed. No signs of infection.   A: Healing vertical C-section incision  P: Staples removed. Steri-strips placed.  Patient given instructions for care of incision site and steri-strips Patient to follow-up with her doctor as previously scheduled for PP care Patient instructed to return to MAU or call [facility] if she has any concerns prior to her scheduled visit  Freddi Starr, PA-C 03/28/2012 7:26 PM

## 2013-01-29 ENCOUNTER — Encounter (HOSPITAL_COMMUNITY): Payer: Self-pay | Admitting: Emergency Medicine

## 2013-01-29 ENCOUNTER — Emergency Department (HOSPITAL_COMMUNITY)
Admission: EM | Admit: 2013-01-29 | Discharge: 2013-01-29 | Disposition: A | Discharge status: Home / Self Care | Payer: Medicaid Other | Attending: Emergency Medicine | Admitting: Emergency Medicine

## 2013-01-29 DIAGNOSIS — R51 Headache: Secondary | ICD-10-CM | POA: Insufficient documentation

## 2013-01-29 DIAGNOSIS — Z8659 Personal history of other mental and behavioral disorders: Secondary | ICD-10-CM | POA: Insufficient documentation

## 2013-01-29 DIAGNOSIS — H60399 Other infective otitis externa, unspecified ear: Secondary | ICD-10-CM | POA: Insufficient documentation

## 2013-01-29 DIAGNOSIS — H6092 Unspecified otitis externa, left ear: Secondary | ICD-10-CM

## 2013-01-29 MED ORDER — CLINDAMYCIN HCL 150 MG PO CAPS: 450.0000 mg | ORAL_CAPSULE | Freq: Three times a day (TID) | ORAL | Status: DC

## 2013-01-29 MED ORDER — ANTIPYRINE-BENZOCAINE 5.4-1.4 % OT SOLN
3.0000 [drp] | OTIC | Status: DC | PRN
  Administered 2013-01-29: 4 [drp] via OTIC
  Filled 2013-01-29: qty 10

## 2013-01-29 MED ORDER — OFLOXACIN 0.3 % OT SOLN
5.0000 [drp] | Freq: Every day | OTIC | Status: DC
  Administered 2013-01-29: 5 [drp] via OTIC
  Filled 2013-01-29: qty 5

## 2013-01-29 NOTE — ED Notes (Signed)
Discharge instructions reviewed with interpretor phone, pt states she has no other questions at this time.

## 2013-01-29 NOTE — ED Notes (Signed)
Pt reports a left earache that started yesterday. Denies any fever or toothache.

## 2013-01-29 NOTE — ED Notes (Signed)
Pt alert, NAD, calm, interactive. Family at [facility]. Rates L ear pain 4/10. Denies other sx. Meds/gtts placed at [facility] with earwick for EDPA.

## 2013-01-29 NOTE — ED Provider Notes (Signed)
CSN: [id number]     Arrival date & time 01/29/13  1734 History   This chart was scribed for non-physician practitioner Dierdre Forth, PA-C working with Derwood Kaplan, MD by Clydene Laming, ED Scribe. This patient was seen in room TR09C/TR09C and the patient's care was started at 8:27  PM.   Chief Complaint  Patient presents with  . Otalgia   Patient is a 37 y.o. female presenting with ear pain.  Otalgia Associated symptoms: no diarrhea, no fever, no headaches, no neck pain, no rash, no rhinorrhea, no sore throat and no vomiting    HPI Comments: Victoria Anthony is a 37 y.o. female who presents to the Emergency Department complaining of left ear pain onset yesterday with associated facial pain. She reports scratching the inside of the ear with foreign objects including a bobby pin and q- TIPS. Pt reports using tylenol w/o relief. She also reports chills which are resolved. Pt denies fever, chest pain, sob, nausea, rhinorrhea, sore throat, diarrhea vomiting. No rash. Hx of depression for which she is treated.    Past Medical History  Diagnosis Date  . Chest pain   . No pertinent past medical history   . Mental disorder    Past Surgical History  Procedure Laterality Date  . Cholecystectomy  ?  Marland Kitchen Cesarean section      X3  . No past surgeries    . Cesarean section  03/23/2012    Procedure: CESAREAN SECTION;  Surgeon: Tilda Burrow, MD;  Location: [facility] ORS;  Service: Obstetrics;  Laterality: N/A;   Family History  Problem Relation Age of Onset  . Stomach cancer     History  Substance Use Topics  . Smoking status: Never Smoker   . Smokeless tobacco: Not on file  . Alcohol Use: No   OB History   Grav Para Term Preterm Abortions TAB SAB Ect Mult Living   4 4 3 1      4      Review of Systems  Constitutional: Positive for chills (resolved). Negative for fever.  HENT: Positive for ear pain. Negative for rhinorrhea and sore throat.   Respiratory: Negative for shortness of  breath.   Cardiovascular: Negative for chest pain.  Gastrointestinal: Negative for nausea, vomiting and diarrhea.  Musculoskeletal: Negative for back pain, neck pain and neck stiffness.  Skin: Negative for rash.  Allergic/Immunologic: Negative for immunocompromised state.  Neurological: Negative for headaches.  Hematological: Does not bruise/bleed easily.  Psychiatric/Behavioral: The patient is not nervous/anxious.     Allergies  Review of patient's allergies indicates no known allergies.  Home Medications   Current Outpatient Rx  Name  Route  Sig  Dispense  Refill  . acetaminophen (TYLENOL) 325 MG tablet   Oral   Take 325 mg by mouth every 6 (six) hours as needed for pain.         . clindamycin (CLEOCIN) 150 MG capsule   Oral   Take 3 capsules (450 mg total) by mouth 3 (three) times daily.   90 capsule   0    Triage Vitals:BP 130/79  Pulse 101  Temp(Src) 98.3 F (36.8 C) (Oral)  Resp 16  SpO2 98% Physical Exam  Constitutional: She is oriented to person, place, and time. She appears well-developed and well-nourished. No distress.  HENT:  Head: Normocephalic and atraumatic.  Right Ear: Tympanic membrane, external ear and ear canal normal.  Left Ear: There is drainage (purulent), swelling and tenderness. Decreased hearing is noted.  Nose: No  mucosal edema or rhinorrhea. No epistaxis. Right sinus exhibits no maxillary sinus tenderness and no frontal sinus tenderness. Left sinus exhibits no maxillary sinus tenderness and no frontal sinus tenderness.  Mouth/Throat: Uvula is midline, oropharynx is clear and moist and mucous membranes are normal. Mucous membranes are not pale and not cyanotic. Normal dentition. No dental abscesses, uvula swelling or dental caries. No oropharyngeal exudate, posterior oropharyngeal edema, posterior oropharyngeal erythema or tonsillar abscesses.  Tonsillar and submandibular Lymphadenopathy on the left side  No dental caries, erythema of the  gumline or gross abscess Clinical otitis externa with purulent drainage; TM not visible; canal not completely closed  Eyes: Conjunctivae are normal. Pupils are equal, round, and reactive to light.  Neck: Normal range of motion and full passive range of motion without pain. No spinous process tenderness and no muscular tenderness present. No rigidity. Normal range of motion present.  No nuchal rigidity  Cardiovascular: Normal rate, regular rhythm, normal heart sounds and intact distal pulses.   No tachycardia  Pulmonary/Chest: Effort normal and breath sounds normal. No stridor. Not tachypneic. She has no decreased breath sounds. She has no wheezes. She has no rhonchi. She has no rales.  Abdominal: Soft. Bowel sounds are normal. There is no tenderness.  Musculoskeletal: Normal range of motion. She exhibits no tenderness.  Lymphadenopathy:    She has no cervical adenopathy.  Neurological: She is alert and oriented to person, place, and time. She exhibits normal muscle tone. Coordination normal.  Skin: Skin is warm and dry. No rash noted. She is not diaphoretic. There is erythema.  Mild erythema of the left external ear without gross cellulitis  Psychiatric: She has a normal mood and affect.    ED Course  Procedures (including critical care time) DIAGNOSTIC STUDIES: Oxygen Saturation is 98% on RA, normal by my interpretation.    COORDINATION OF CARE: 8:36 PM- Discussed treatment plan with pt at bedside. Pt verbalized understanding and agreement with plan.   Labs Review Labs Reviewed - No data to display Imaging Review No results found.  EKG Interpretation   None       MDM   1. Otitis externa of left ear      Victoria Anthony presents with otitis externa.  Pt presenting with otitis externa after using a bobby pin to scratch her ear. No canal occlusion, Pt afebrile in NAD. Exam non concerning for mastoiditis, cellulitis or malignant OE. Ear wick placed in ED. Dc with ofloxacin  script and clindamycin PO.  Advised  follow up in ED 2-3 days if no improvement with treatment or no complete resolution by 7 days. Earlier f-u if pt develops rash, allergic reaction to medication, or loss of hearing.  It has been determined that no acute conditions requiring further emergency intervention are present at this time. The patient/guardian have been advised of the diagnosis and plan. We have discussed signs and symptoms that warrant return to the ED, such as changes or worsening in symptoms.   Vital signs are stable at discharge.  Pt not clinically tachycardic on exam.   BP 130/79  Pulse 101  Temp(Src) 98.3 F (36.8 C) (Oral)  Resp 16  SpO2 98%  Patient/guardian has voiced understanding and agreed to follow-up with the PCP or specialist.   I personally performed the services described in this documentation, which was scribed in my presence. The recorded information has been reviewed and is accurate.    Dahlia Client [address], PA-C 01/29/13 2158

## 2013-01-29 NOTE — ED Notes (Signed)
EDPA into room prior to RN assessment, see PA notes, using language line (Spanish), pending orders.

## 2013-01-30 NOTE — ED Provider Notes (Signed)
Medical screening examination/treatment/procedure(s) were performed by non-physician practitioner and as supervising physician I was immediately available for consultation/collaboration.  EKG Interpretation   None         Derwood Kaplan, MD 01/30/13 1511

## 2014-02-10 ENCOUNTER — Encounter (HOSPITAL_COMMUNITY): Payer: Self-pay | Admitting: Emergency Medicine

## 2015-09-03 ENCOUNTER — Encounter: Payer: Self-pay | Admitting: Pediatric Intensive Care

## 2015-09-03 DIAGNOSIS — I10 Essential (primary) hypertension: Secondary | ICD-10-CM

## 2015-09-03 NOTE — Congregational Nurse Program (Signed)
Congregational Nurse Program Note  Date of Encounter: 09/03/2015  Past Medical History: Past Medical History  Diagnosis Date  . Chest pain   . No pertinent past medical history   . Mental disorder     Encounter Details:     CNP Questionnaire - 09/03/15 1430    Patient Demographics   Is this a new or existing patient? New   Patient is considered a/an Immigrant   Race Latino/Hispanic   Patient Assistance   Location of Patient Assistance [employer] [place]   Patient's financial/insurance status [employer]   Uninsured Patient Yes   Patient referred to apply for the following financial assistance [employer] Renew[employer]   Food insecurities addressed Provided food supplies   Transportation assistance No   Assistance securing medications No   Educational health offerings Hypertension   Encounter Details   Primary purpose of visit Acute Illness/Condition Visit   Was an Emergency Department visit averted? Yes   Does patient have a medical provider? Yes   Patient referred to Follow up with established PCP   Was a mental health screening completed? (GAINS tool) No   Does patient have dental issues? No   Does patient have vision issues? No   Does your patient have an abnormal blood pressure today? Yes   Since previous encounter, have you referred patient for abnormal blood pressure that resulted in a new diagnosis or medication change? No   Does your patient have an abnormal blood glucose today? No   Since previous encounter, have you referred patient for abnormal blood glucose that resulted in a new diagnosis or medication change? No   Was there a life-saving intervention made? No     Client seen today for blood pressure check. She denies (via interpreter Clarissa) that she has history of hypertension but states she takes medicine for headaches which she obtained in [place]. She did not know name of medicine. BPs elevated (see VS flow sheet) x 2 and client states  she has intermittent floaters and pulsing at temples. She has a PCP and [employer]. CN recommended follow up with PCP and follow up with CN at [facility] for blood pressure checks.Client states that she has had a stressful day. CN counseled to  reduce stress as much as possible.

## 2016-05-13 DIAGNOSIS — R112 Nausea with vomiting, unspecified: Secondary | ICD-10-CM | POA: Insufficient documentation

## 2016-05-13 DIAGNOSIS — Z79899 Other long term (current) drug therapy: Secondary | ICD-10-CM | POA: Insufficient documentation

## 2016-05-14 ENCOUNTER — Encounter (HOSPITAL_COMMUNITY): Payer: Self-pay | Admitting: Emergency Medicine

## 2016-05-14 ENCOUNTER — Emergency Department (HOSPITAL_COMMUNITY)
Admission: EM | Admit: 2016-05-14 | Discharge: 2016-05-14 | Disposition: A | Discharge status: Home / Self Care | Payer: Self-pay | Attending: Emergency Medicine | Admitting: Emergency Medicine

## 2016-05-14 DIAGNOSIS — R112 Nausea with vomiting, unspecified: Secondary | ICD-10-CM

## 2016-05-14 HISTORY — DX: Pure hypercholesterolemia, unspecified: E78.00

## 2016-05-14 LAB — CBC WITH DIFFERENTIAL/PLATELET
Basophils Absolute: 0 10*3/uL (ref 0.0–0.1)
Basophils Relative: 0 %
Eosinophils Absolute: 0.2 10*3/uL (ref 0.0–0.7)
Eosinophils Relative: 2 %
HCT: 40.9 % (ref 36.0–46.0)
HEMOGLOBIN: 13.9 g/dL (ref 12.0–15.0)
LYMPHS ABS: 2.7 10*3/uL (ref 0.7–4.0)
LYMPHS PCT: 23 %
MCH: 29 pg (ref 26.0–34.0)
MCHC: 34 g/dL (ref 30.0–36.0)
MCV: 85.4 fL (ref 78.0–100.0)
Monocytes Absolute: 0.4 10*3/uL (ref 0.1–1.0)
Monocytes Relative: 4 %
NEUTROS ABS: 8.4 10*3/uL — AB (ref 1.7–7.7)
NEUTROS PCT: 71 %
PLATELETS: 241 10*3/uL (ref 150–400)
RBC: 4.79 MIL/uL (ref 3.87–5.11)
RDW: 12.5 % (ref 11.5–15.5)
WBC: 11.7 10*3/uL — AB (ref 4.0–10.5)

## 2016-05-14 LAB — COMPREHENSIVE METABOLIC PANEL
ALT: 22 U/L (ref 14–54)
AST: 25 U/L (ref 15–41)
Albumin: 4.3 g/dL (ref 3.5–5.0)
Alkaline Phosphatase: 70 U/L (ref 38–126)
Anion gap: 10 (ref 5–15)
BUN: 11 mg/dL (ref 6–20)
CALCIUM: 9.1 mg/dL (ref 8.9–10.3)
CHLORIDE: 107 mmol/L (ref 101–111)
CO2: 23 mmol/L (ref 22–32)
CREATININE: 0.51 mg/dL (ref 0.44–1.00)
Glucose, Bld: 130 mg/dL — ABNORMAL HIGH (ref 65–99)
Potassium: 3.2 mmol/L — ABNORMAL LOW (ref 3.5–5.1)
SODIUM: 140 mmol/L (ref 135–145)
Total Bilirubin: 0.4 mg/dL (ref 0.3–1.2)
Total Protein: 7.6 g/dL (ref 6.5–8.1)

## 2016-05-14 LAB — URINALYSIS, ROUTINE W REFLEX MICROSCOPIC
Bilirubin Urine: NEGATIVE
Glucose, UA: NEGATIVE mg/dL
KETONES UR: NEGATIVE mg/dL
Leukocytes, UA: NEGATIVE
NITRITE: NEGATIVE
PROTEIN: 30 mg/dL — AB
Specific Gravity, Urine: 1.023 (ref 1.005–1.030)
pH: 5 (ref 5.0–8.0)

## 2016-05-14 LAB — PREGNANCY, URINE: PREG TEST UR: NEGATIVE

## 2016-05-14 MED ORDER — PROCHLORPERAZINE EDISYLATE 5 MG/ML IJ SOLN
10.0000 mg | Freq: Once | INTRAMUSCULAR | Status: AC
  Administered 2016-05-14: 10 mg via INTRAMUSCULAR
  Filled 2016-05-14: qty 2

## 2016-05-14 MED ORDER — DIPHENHYDRAMINE HCL 50 MG/ML IJ SOLN
25.0000 mg | Freq: Once | INTRAMUSCULAR | Status: AC
  Administered 2016-05-14: 25 mg via INTRAMUSCULAR
  Filled 2016-05-14: qty 1

## 2016-05-14 MED ORDER — ONDANSETRON 4 MG PO TBDP: 4.0000 mg | ORAL_TABLET | Freq: Three times a day (TID) | ORAL | 0 refills | Status: DC | PRN

## 2016-05-14 NOTE — ED Triage Notes (Signed)
Pt to ED with c/o nausea, vomiting and headache x's 2 days.  Pt denies diarrhea.  Pt also c/o feeling dizzy

## 2016-05-14 NOTE — ED Provider Notes (Signed)
[facility]-EMERGENCY DEPT Provider Note   CSN: [id number] Arrival date & time: 05/13/16  2355  By signing my name below, I, Rosario Adie, attest that this documentation has been prepared under the direction and in the presence of Melene Plan, DO. Electronically Signed: Rosario Adie, ED Scribe. 05/14/16. 1:49 AM.  History   Chief Complaint Chief Complaint  Patient presents with  . Emesis   The history is provided by the patient. A language interpreter was used [place] [phone]).  Emesis   This is a new problem. The current episode started more than 2 days ago. The problem occurs 2 to 4 times per day. The problem has been gradually worsening. The emesis has an appearance of stomach contents. There has been no fever. Associated symptoms include headaches. Pertinent negatives include no abdominal pain, no arthralgias, no chills, no diarrhea, no fever and no myalgias.    HPI Comments: Victoria Anthony is a 41 y.o. female who presents to the Emergency Department complaining of persistent nausea and episodic vomiting beginning three days ago, worsening since yesterday morning. Pt reports associated dizziness and a mild generalized headache as well. Her vomitus is non-bloody and non-bilious. She has been taking Tylenol at home without significant relief. No sick contacts with similar symptoms, however, she does have school-aged children at home. She denies fever, abdominal pain, diarrhea, or any other associated symptoms.   Past Medical History:  Diagnosis Date  . Chest pain   . High cholesterol   . Mental disorder   . No pertinent past medical history    There are no active problems to display for this patient.  Past Surgical History:  Procedure Laterality Date  . CESAREAN SECTION     X3  . CESAREAN SECTION  03/23/2012   Procedure: CESAREAN SECTION;  Surgeon: Tilda Burrow, MD;  Location: [facility];  Service: Obstetrics;  Laterality: N/A;  . CHOLECYSTECTOMY  ?  . NO PAST  SURGERIES     OB History    Gravida Para Term Preterm AB Living   4 4 3 1   4    SAB TAB Ectopic Multiple Live Births           4     Home Medications    Prior to Admission medications   Medication Sig Start Date End Date Taking? Authorizing Provider  acetaminophen (TYLENOL) 325 MG tablet Take 325 mg by mouth every 6 (six) hours as needed for pain.    Historical Provider, MD  clindamycin (CLEOCIN) 150 MG capsule Take 3 capsules (450 mg total) by mouth 3 (three) times daily. 01/29/13   Hannah Muthersbaugh, PA-C  ondansetron (ZOFRAN ODT) 4 MG disintegrating tablet Take 1 tablet (4 mg total) by mouth every 8 (eight) hours as needed for nausea or vomiting. 05/14/16   Melene Plan, DO   Family History Family History  Problem Relation Age of Onset  . Stomach cancer     Social History Social History  Substance Use Topics  . Smoking status: Never Smoker  . Smokeless tobacco: Never Used  . Alcohol use No   Allergies   Patient has no known allergies.  Review of Systems Review of Systems  Constitutional: Negative for chills and fever.  HENT: Negative for congestion and rhinorrhea.   Eyes: Negative for redness and visual disturbance.  Respiratory: Negative for shortness of breath and wheezing.   Cardiovascular: Negative for chest pain and palpitations.  Gastrointestinal: Positive for nausea and vomiting. Negative for abdominal pain and diarrhea.  Genitourinary:  Negative for dysuria and urgency.  Musculoskeletal: Negative for arthralgias and myalgias.  Skin: Negative for pallor and wound.  Neurological: Positive for dizziness and headaches.  All other systems reviewed and are negative.  Physical Exam Updated Vital Signs BP 143/91   Pulse 91   Temp 98.2 F (36.8 C) (Oral)   Resp 16   LMP 05/01/2016 (Exact Date)   SpO2 100%   Physical Exam  Constitutional: She is oriented to person, place, and time. She appears well-developed and well-nourished. No distress.  Well appearing.     HENT:  Head: Normocephalic and atraumatic.  Eyes: EOM are normal. Pupils are equal, round, and reactive to light.  Neck: Normal range of motion. Neck supple.  Cardiovascular: Normal rate and regular rhythm.  Exam reveals no gallop and no friction rub.   No murmur heard. Pulmonary/Chest: Effort normal. She has no wheezes. She has no rales.  Abdominal: Soft. She exhibits no distension. There is no tenderness. There is no rebound and no guarding.  Musculoskeletal: She exhibits no edema or tenderness.  Neurological: She is alert and oriented to person, place, and time.  Skin: Skin is warm and dry. She is not diaphoretic.  Psychiatric: She has a normal mood and affect. Her behavior is normal.  Nursing note and vitals reviewed.  ED Treatments / Results  DIAGNOSTIC STUDIES: Oxygen Saturation is 100% on RA, normal by my interpretation.   COORDINATION OF CARE: 1:49 AM-Discussed next steps with pt. Pt verbalized understanding and is agreeable with the plan.   Labs (all labs ordered are listed, but only abnormal results are displayed) Labs Reviewed  CBC WITH DIFFERENTIAL/PLATELET - Abnormal; Notable for the following:       Result Value   WBC 11.7 (*)    Neutro Abs 8.4 (*)    All other components within normal limits  COMPREHENSIVE METABOLIC PANEL - Abnormal; Notable for the following:    Potassium 3.2 (*)    Glucose, Bld 130 (*)    All other components within normal limits  URINALYSIS, ROUTINE W REFLEX MICROSCOPIC - Abnormal; Notable for the following:    APPearance CLOUDY (*)    Hgb urine dipstick LARGE (*)    Protein, ur 30 (*)    Bacteria, UA FEW (*)    Squamous Epithelial / LPF 0-5 (*)    All other components within normal limits  PREGNANCY, URINE   EKG  EKG Interpretation None      Radiology No results found.  Procedures Procedures   Medications Ordered in ED Medications  prochlorperazine (COMPAZINE) injection 10 mg (not administered)  diphenhydrAMINE  (BENADRYL) injection 25 mg (not administered)    Initial Impression / Assessment and Plan / ED Course  I have reviewed the triage vital signs and the nursing notes.  Pertinent labs & imaging results that were available during my care of the patient were reviewed by me and considered in my medical decision making (see chart for details).     41 yo F With 3 days of vomiting. Denies any abdominal pain. Feeling mildly weak at home. On my exam patient is well-appearing and nontoxic. Labs drawn triage that are unremarkable. She has a benign abdominal exam. She is complaining of some mild lightheadedness. I'll give her antiemetics here. Discharge home with PCP follow-up.  1:57 AM:  I have discussed the diagnosis/risks/treatment options with the patient and family and believe the pt to be eligible for discharge home to follow-up with PCP. We also discussed returning to the ED  immediately if new or worsening sx occur. We discussed the sx which are most concerning (e.g., sudden worsening pain, fever, inability to tolerate by mouth) that necessitate immediate return. Medications administered to the patient during their visit and any new prescriptions provided to the patient are listed below.  Medications given during this visit Medications  prochlorperazine (COMPAZINE) injection 10 mg (not administered)  diphenhydrAMINE (BENADRYL) injection 25 mg (not administered)     The patient appears reasonably screen and/or stabilized for discharge and I doubt any other medical condition or other [facility] requiring further screening, evaluation, or treatment in the ED at this time prior to discharge.    Final Clinical Impressions(s) / ED Diagnoses   Final diagnoses:  Nausea and vomiting, intractability of vomiting not specified, unspecified vomiting type   New Prescriptions New Prescriptions   ONDANSETRON (ZOFRAN ODT) 4 MG DISINTEGRATING TABLET    Take 1 tablet (4 mg total) by mouth every 8 (eight) hours as  needed for nausea or vomiting.   I personally performed the services described in this documentation, which was scribed in my presence. The recorded information has been reviewed and is accurate.     Melene Planan Ladd Cen, DO 05/14/16 0157

## 2016-05-14 NOTE — Discharge Instructions (Signed)
Follow up with your PCP in a couple of days.  Try a bland diet of bananas rice applesauce and toast. For the next day. Drink plenty of fluids.

## 2016-07-26 LAB — CYTOLOGY - PAP
GLUCOSE 1 HOUR: 193
PAP SMEAR: NEGATIVE
URINE CULTURE, OB: NEGATIVE

## 2016-07-26 LAB — OB RESULTS CONSOLE ANTIBODY SCREEN: Antibody Screen: NEGATIVE

## 2016-07-26 LAB — OB RESULTS CONSOLE RUBELLA ANTIBODY, IGM
Rubella: IMMUNE
Rubella: IMMUNE

## 2016-07-26 LAB — OB RESULTS CONSOLE VARICELLA ZOSTER ANTIBODY, IGG: Varicella: NON-IMMUNE/NOT IMMUNE

## 2016-07-26 LAB — OB RESULTS CONSOLE GC/CHLAMYDIA
CHLAMYDIA, DNA PROBE: NEGATIVE
Gonorrhea: NEGATIVE

## 2016-07-26 LAB — OB RESULTS CONSOLE ABO/RH: RH Type: NEGATIVE

## 2016-07-26 LAB — OB RESULTS CONSOLE RPR: RPR: NONREACTIVE

## 2016-07-27 ENCOUNTER — Other Ambulatory Visit (HOSPITAL_COMMUNITY): Payer: Self-pay | Admitting: Nurse Practitioner

## 2016-07-27 DIAGNOSIS — Z3A13 13 weeks gestation of pregnancy: Secondary | ICD-10-CM

## 2016-07-27 DIAGNOSIS — Z3682 Encounter for antenatal screening for nuchal translucency: Secondary | ICD-10-CM

## 2016-08-01 ENCOUNTER — Encounter: Payer: Medicaid Other | Attending: Obstetrics & Gynecology | Admitting: *Deleted

## 2016-08-01 ENCOUNTER — Ambulatory Visit: Payer: Self-pay | Admitting: *Deleted

## 2016-08-01 DIAGNOSIS — Z3A00 Weeks of gestation of pregnancy not specified: Secondary | ICD-10-CM | POA: Diagnosis not present

## 2016-08-01 DIAGNOSIS — O24410 Gestational diabetes mellitus in pregnancy, diet controlled: Secondary | ICD-10-CM

## 2016-08-01 DIAGNOSIS — O24419 Gestational diabetes mellitus in pregnancy, unspecified control: Secondary | ICD-10-CM | POA: Diagnosis not present

## 2016-08-01 DIAGNOSIS — O0990 Supervision of high risk pregnancy, unspecified, unspecified trimester: Secondary | ICD-10-CM | POA: Insufficient documentation

## 2016-08-01 DIAGNOSIS — O09529 Supervision of elderly multigravida, unspecified trimester: Secondary | ICD-10-CM | POA: Diagnosis not present

## 2016-08-01 NOTE — Progress Notes (Signed)
  Patient was seen on 08/01/2016 for Gestational Diabetes self-management .Patient states history of GDM with her son who is now 42 years old. Spanish interpretor here for visit. Patient states she is having lots of nausea so food intake is sparse right now.  The following learning objectives were met by the patient :   States the definition of Gestational Diabetes  States why dietary management is important in controlling blood glucose  Describes the effects of carbohydrates on blood glucose levels  Demonstrates ability to create a balanced meal plan  Demonstrates carbohydrate counting   States when to check blood glucose levels  Demonstrates proper blood glucose monitoring techniques  States the effect of stress and exercise on blood glucose levels  States the importance of limiting caffeine and abstaining from alcohol and smoking  Plan:  Aim for 3 Carb Choices per meal (45 grams) +/- 1 either way  Aim for 1-2 Carbs per snack Begin reading food labels for Total Carbohydrate of foods Consider  increasing your activity level by walking or other activity daily as tolerated Begin checking BG before breakfast and 2 hours after first bite of breakfast, lunch and dinner as directed by MD  Take medication if directed by MD  Blood glucose monitor given: [employer] Track Lot # [id number] Exp: 07/30/2018 Blood glucose reading: 117 mg/dl post breakfast Patient instructed to test pre breakfast and 2 hours each meal as directed by MD Bring Log Book to every medical appointment   Patient instructed to monitor glucose levels: FBS: 60 - <90 2 hour: <120  Patient received the following handouts:  Nutrition Diabetes and Pregnancy  Carbohydrate Counting List  Patient will be seen for follow-up as needed.

## 2016-08-04 ENCOUNTER — Encounter: Payer: Self-pay | Admitting: *Deleted

## 2016-08-04 ENCOUNTER — Encounter (HOSPITAL_COMMUNITY): Payer: Self-pay

## 2016-08-04 DIAGNOSIS — O24319 Unspecified pre-existing diabetes mellitus in pregnancy, unspecified trimester: Secondary | ICD-10-CM | POA: Insufficient documentation

## 2016-08-04 DIAGNOSIS — O09529 Supervision of elderly multigravida, unspecified trimester: Secondary | ICD-10-CM

## 2016-08-04 DIAGNOSIS — O0990 Supervision of high risk pregnancy, unspecified, unspecified trimester: Secondary | ICD-10-CM | POA: Insufficient documentation

## 2016-08-04 DIAGNOSIS — Z559 Problems related to education and literacy, unspecified: Secondary | ICD-10-CM | POA: Insufficient documentation

## 2016-08-04 DIAGNOSIS — O24419 Gestational diabetes mellitus in pregnancy, unspecified control: Secondary | ICD-10-CM

## 2016-08-05 ENCOUNTER — Ambulatory Visit (HOSPITAL_COMMUNITY): Payer: Self-pay

## 2016-08-05 ENCOUNTER — Ambulatory Visit (HOSPITAL_COMMUNITY)
Admission: RE | Admit: 2016-08-05 | Discharge: 2016-08-05 | Disposition: A | Discharge status: Home / Self Care | Payer: Self-pay | Source: Ambulatory Visit | Attending: Nurse Practitioner | Admitting: Nurse Practitioner

## 2016-08-05 ENCOUNTER — Encounter (HOSPITAL_COMMUNITY): Payer: Self-pay

## 2016-08-08 ENCOUNTER — Ambulatory Visit (INDEPENDENT_AMBULATORY_CARE_PROVIDER_SITE_OTHER): Payer: Self-pay | Admitting: Family Medicine

## 2016-08-08 ENCOUNTER — Encounter: Payer: Self-pay | Admitting: Family Medicine

## 2016-08-08 VITALS — BP 111/66 | HR 83 | Wt 136.8 lb

## 2016-08-08 DIAGNOSIS — Z789 Other specified health status: Secondary | ICD-10-CM

## 2016-08-08 DIAGNOSIS — O09522 Supervision of elderly multigravida, second trimester: Secondary | ICD-10-CM | POA: Diagnosis not present

## 2016-08-08 DIAGNOSIS — O09529 Supervision of elderly multigravida, unspecified trimester: Secondary | ICD-10-CM

## 2016-08-08 DIAGNOSIS — O0992 Supervision of high risk pregnancy, unspecified, second trimester: Secondary | ICD-10-CM

## 2016-08-08 DIAGNOSIS — Z23 Encounter for immunization: Secondary | ICD-10-CM

## 2016-08-08 DIAGNOSIS — O24419 Gestational diabetes mellitus in pregnancy, unspecified control: Secondary | ICD-10-CM

## 2016-08-08 DIAGNOSIS — O0990 Supervision of high risk pregnancy, unspecified, unspecified trimester: Secondary | ICD-10-CM

## 2016-08-08 DIAGNOSIS — O34219 Maternal care for unspecified type scar from previous cesarean delivery: Secondary | ICD-10-CM | POA: Insufficient documentation

## 2016-08-08 LAB — POCT URINALYSIS DIP (DEVICE)
BILIRUBIN URINE: NEGATIVE
Glucose, UA: NEGATIVE mg/dL
HGB URINE DIPSTICK: NEGATIVE
Ketones, ur: NEGATIVE mg/dL
LEUKOCYTES UA: NEGATIVE
NITRITE: NEGATIVE
PH: 6 (ref 5.0–8.0)
Protein, ur: NEGATIVE mg/dL
Specific Gravity, Urine: >=1.03 (ref 1.005–1.030)
Urobilinogen, UA: 0.2 mg/dL (ref 0.0–1.0)

## 2016-08-08 MED ORDER — ASPIRIN EC 81 MG PO TBEC: 81.0000 mg | DELAYED_RELEASE_TABLET | Freq: Every day | ORAL | 4 refills | Status: DC

## 2016-08-08 NOTE — Progress Notes (Signed)
Scheduled for eye exam with [facility] 09/06/16 at 10:30. Called Dr. Casilda Carls office and left message to schedule echo

## 2016-08-08 NOTE — Progress Notes (Signed)
Here for first prenatal visit. [facility]. Given new patient education. Wants to have BTL after this pregnancy. Signed up for [employer] app only. Scheduled for anatomy US at [facility] 09/02/16 0930.

## 2016-08-08 NOTE — Patient Instructions (Signed)
Diabetes mellitus gestacional, cuidados personales (Gestational Diabetes Mellitus, Self Care) Las personas que tienen diabetes gestacional (diabetes mellitus gestacional) deben Theatre manager el nivel de glucosa en la sangre bajo control. Es posible hacerlo a travs de lo siguiente:  Nutricin.  Actividad fsica.  Cambios en el estilo de vida.  Medicamentos o insulina, si es necesario.  El 51 de los mdicos y de Producer, television/film/video. CMO ME CONTROLO EL NIVEL DE GLUCOSA EN LA SANGRE?  [place]. Haga los controles con la frecuencia que le hayan indicado.  Llame al mdico si el nivel de glucosa en la sangre est por encima de las cifras ideales en 2anlisis seguidos. El mdico fijar los objetivos del tratamiento para usted. Intente tener estos niveles de glucemia en la sangre:  Despus de no haber comido durante mucho tiempo (despus de ayunar): igual o menor que 52m/dl (5,363ml/l).  Despus de las comidas (posprandial):  Una hora despus de una comida: igual o menor que 14030ml (7,8mm61ml).  Dos horas despus de una comida: igual o menor que 120mg54m(6,7mmol8m.  Nivel de A1c (hemoglobinaA1c): del 6% al 6,5%. QU DEBO SABER ACERCA DE LA GLUCEMIA ALTA? El nivel alto de glucosa en la sangre se denomina hiperglucemia. Conozca los signos tempranos de la glucemia alta. Algunos signos son los siguientes:  Sentir que tiene lo siguiente:  Sed.  Apetito.  Mucho cansancio.  Necesidad de orinarGarment/textile technologistayor frecuencia que lo habitual.  Visin borrosa. QU DEBO SABER ACERCA DE LA GLUCEMIA BAJA? El nivel bajo de glucosa en la sangre se denomina hipoglucemia. Este cuadro ocurre cuando el nivel de glucosa en la sangre es igual o menor que 70mg/d92m,59mmol/l37mEntre los sntomas se pueden incluir los siguientes:  Sentir que tiene lo siguiente:  Apetito.  Preocupacin o nervios (ansioso).  Sudoracin o piel  hmeda.  Confusin.  Mareos.  Somnolencia.  Nuseas.  Tener lo siguiente:  Latidos cardacos acelerados.  Dolor de cabeza. Netherlandsmbio en la visin.  Una crisis de movimientos que no puede controlar (convulsiones).  Pesadillas.  Hormigueo y falta de sensibilidad (adormecimiento) alrededor de la boca, los labios o la lengua. Casevilleultades para hacer lo siguiente:  Hablar.  Prestar atencin (concentrarse).  Moverse (coordinacin).  Dormir.  Temblores.  Desmayos.  Molestarse con facilidad (irritabilidad). Tratamiento de la glucemia baja Para tratar la glucemia baja, ingiera un alimento o una bebida azucarada de inmediato. Si puede pensar con claridad y tragar de manera segura, siga la regla 15/15, que consiste en lo siguiente:  Consumir 15gramos de un hidrato de carbono de accin rpida. Algunos hidratos de carbono de accin rpida son los siguientes:  1pomo de glucosa en gel.  3comprimidos de azcar (comprimidos de glucosa).  6 a 8unidades de caramelos duros.  4onzas (120ml) de63mo de frutas.  4onzas (120ml) de 4mosa comn (no diettica).  Contrlese la glucemia 15minutos 459mus de ingerir el hidrato de carbono.  Si el nivel de glucosa en la sangre todava es igual o menor que 70mg/dl (3,57ml/l), in33mra nuevamente 15gramos de un hidrato de carbono.  Si el nivel de glucosa en la sangre no supera los 70mg/dl (3,59m459ml) despu74me 3intentos, solicite ayuda de inmediato.  Ingiera una comida o una colacin en el transcurso de 1hora despus de que la glucemia se haya normalizado. Tratamiento de la glucemia muy baja Si el nivel de glucosa en la sangre es igual o menor que 54mg/dl (3mmol/60msignif64m que est muy bajo (hipoglucemia grave). Esto es  una emergencia. No espere hasta que los sntomas desaparezcan. Solicite atencin mdica de inmediato. Comunquese con el servicio de emergencias de su localidad (911 en los [place]). No  conduzca por sus propios medios [employer]. Si su nivel de glucosa en la sangre muy bajo y no puede ingerir ningn alimento ni bebida, tal vez deba aplicarse una inyeccin de glucagn. Un familiar o un amigo deben aprender a hacer lo siguiente:  Controlarle el nivel de glucosa en la sangre.  Cmo aplicar una inyeccin de glucagn. Pregntele al mdico si necesita tener un kit de inyecciones de glucagn en su casa. QU OTRAS COSAS SON IMPORTANTES PARA CONTROLAR LA DIABETES? Medicamentos  Aplquese la insulina y tome los medicamentos para la diabetes como se lo hayan indicado.  No se quede sin insulina o sin medicamentos.  Ajuste las dosis de insulina y de los medicamentos para la diabetes como se lo hayan indicado. Alimentos  Opte por opciones de alimentos saludables. Estos incluyen los siguientes:  Pollo, pescado, claras de huevos y frijoles.  Avena, salvado, trigo burgol, arroz integral, quinua y mijo.  Lambert Mody y verduras frescas.  Productos lcteos descremados.  Frutos secos, aguacate, aceite de [place] y aceite de canola.  Arme un plan de alimentacin. Un especialista en nutricin (nutricionista) puede ayudarlo.  Siga las indicaciones del mdico respecto de lo que no puede comer o beber.  Beba suficiente lquido para mantener el pis (orina) claro o de color amarillo plido.  Ingiera colaciones saludables entre comidas saludables.  Lleve un registro de los hidratos de carbono que consume. Lea las etiquetas de los alimentos. Conozca el tamao de las porciones de los alimentos.  Siga el plan para los das de enfermedad cuando no pueda comer o beber normalmente. Arme este plan con el mdico. Actividad  Haga actividad fsica durante 77mnutos o ms por da, o durante el tiempo que le haya indicado el mdico.  Hable con el mdico si comienza un ejercicio nuevo. Es posible que el mdico deba hacer ajustes en la iMauricetown los medicamentos o los alimentos. Estilo de  vida  No beba alcohol.  No use productos que contengan tabaco. Si necesita ayuda para dejar de fumar, consulte al mdico.  Aprenda a sobrellevar el estrs. Si necesita ayuda para lograrlo, consulte al mMeadWestvaco Cuidado del cuerpo  Mantngase al da con las vacunas.  Cepllese los dientes y lAudub[place] Use el hilo dental al menos una vez al da.  Vaya al dentista al menos una vez cada 615mes.  Mantenga un peso saTax adviserInstrucciones generales  ToDelphie venta libre y los recetados solamente como se lo haya indicado el mdico.  Pregntele al mdico sobre los riesgos de la hipertensin arterial en el embarazo. Estos se conocen como preeclampsia y eclampsia.  Comparta su plan de atencin de la diabetes con estas personas:  Compaeros de trabajo o de la escuela.  Aquellas con las que coWauneta Hgase anlisis de orina para deProduct managerresencia de cetonas:  Cuando est enferma.  Como se lo haya indicado el mdico.  Pregntele al mdico lo siguiente:  Debo reunirme con unRadio broadcast assistantara el cuidado de la diabetes?  Dnde puedo encontrar un grupo de apoyo para personas diabticas?  Lleve consigo una tarjeta, o use un brazalete o una medalla que indiquen que es diabtica.  Cumpla con todas las visitas de control con su mdico. Esto es importante. Cuidados despus del paPrimary school teacher  nivel glucemia 4 a 12semanas despus del parto.  Hgase controles de deteccin de la diabetes al menos cada 3aos. DNDE ENCONTRAR MS INFORMACIN: Para obtener ms informacin sobre la diabetes, visite los siguientes sitios:  Asociacin Americana de la Diabetes (American Diabetes Association): www.diabetes.org/diabetes-basics/gestational  Centros para Building surveyor y la Prevencin de Probation officer for Disease Control and Prevention, CDC): http://sanchez-watson.com/.pdf Esta informacin no tiene  Marine scientist el consejo del mdico. Asegrese de hacerle al mdico cualquier pregunta que tenga. Document Released: 07/20/2015 Document Revised: 07/20/2015 Document Reviewed: 05/01/2015 Elsevier Interactive Patient Education  2017 [place] de diabetes mellitus gestacional (Gestational Diabetes Mellitus, Diagnosis) La diabetes gestacional (diabetes mellitus gestacional) es una forma temporal de diabetes que algunas mujeres desarrollan durante el Media planner. Generalmente, ocurre alrededor [place] 24 a la 28de gestacin y desaparece despus del parto. Los cambios hormonales durante el embarazo pueden interferir en la produccin y la actividad de la [place], lo que puede derivar en uno de estos problemas o en ambos:  El pncreas no produce la cantidad suficiente de una hormona llamada insulina.  Las clulas del cuerpo no responden de [place] a la insulina que el organismo produce (resistencia a la insulina). Normalmente, la insulina estimula el ingreso de la glucosa en las clulas del cuerpo. Las clulas usan la glucosa para Dealer. La resistencia a la insulina o la falta de esta hormona hace que el exceso de glucosa se acumule en la sangre, en lugar de ir a las clulas. Como resultado, aumenta la glucemia (hiperglucemia). CULES SON LOS RIESGOS? Si la diabetes gestacional se trata, hay pocas probabilidades de que ocasione problemas. Si no se la controla con tratamiento, puede causar problemas durante el [place] de parto y [place], y algunos de esos problemas pueden ser dainos para el feto y la [place]. La diabetes gestacional que no se controla tambin puede producir problemas respiratorios y bajo nivel de glucemia en el recin nacido. Las mujeres que tienen diabetes gestacional son ms propensas a Lawyer afeccin si se embarazan de nuevo y a tener diabetes tipo2 en el futuro. QU INCREMENTA EL RIESGO? Es ms probable que IT trainer en las mujeres embarazadas que:  Son Keddie de 24aos durante el [place].  Tienen antecedentes familiares de diabetes.  Tienen sobrepeso.  Tuvieron diabetes gestacional en el pasado.  Tienen sndrome de ovario poliqustico (SOP).  Tienen un embarazo gemelar o un embarazo mltiple.  Son descendientes de indgenas norteamericanos, afroamericanos, hispanos o latinos, o asiticos o isleos del [place] LOS SIGNOS O LOS SNTOMAS? La mayora de las mujeres no perciben los sntomas de la diabetes gestacional porque son similares a los sntomas normales del Media planner. Los sntomas de la diabetes gestacional pueden incluir, entre otros:  Aumento de la sed (polidipsia).  Aumento del apetito (polifagia).  Aumento de la miccin (poliuria). [place] SE DIAGNOSTICA?  Esta afeccin se puede diagnosticar en funcin del nivel de glucemia que puede medirse con uno o ms de los siguientes anlisis de sangre:  Medicin de la glucemia en [place]. No se le permitir comer (tendr que [employer]) durante al menos 8horas antes de que se tome una [place].  Prueba al azar de la glucemia. Esta prueba mide la glucemia en cualquier momento del da, sin importar cundo comi.  Prueba de tolerancia a la glucosa oral (PTGO). Generalmente, se realiza [place] [employer] 24 a la 28de gestacin.  Para esta prueba, le harn una medicin de la glucemia  en ayunas. Luego, tomar una bebida que contiene glucosa. Se analizar el nivel de glucemia nuevamente 1hora despus de tomar la bebida con glucosa (PTGO a la hora).  Si el resultado de la PTGO a la hora es igual o superior a 119m/dl (7,868ml/l), le repetirn la PTGO. Esta vez, se analizar el nivel de glucemia 3horas despus de tomar la bebida con glucosa (PTGO a las 3horas). Si tiene factores de riGunnisonpueden hacerle pruebas de deteccin de la diabetes tipo2 no diagnosticada en la primera visita de atencin mdPatent attorneyvisita prenatal). CMO SE TRATA ESTA AFECCIN? El tratamiento puede estar a cargo de unResearch officer, trade unionPara tratar esta afeccin, debe seguir las indicaciones del mdico respecto de lo siguiente:  Comer una dieta ms saludable y aumentar la actividad fsica. Estos cambios son lo ms importante para mantener la diabetes gestacional bajo control.  Controlarse la glucemia. Hgalo con la frecuencia que le hayan indicado.  Tomar los medicamentos para la diabetes o aplicarse la inSanmina-SCIEstos frmacos se recetarn solamente si son necesarios.  Si usCanadansulina, tal vez deba ajustar las dosis en funcin de la cantidad de acSamoasica que realiza y de los alimentos que consume. El mdico le indicar cmo hacerlo. El mdico esAllstatebjetivos del tratamiento para usted sobre la base de la etapa del emMedia plannern la que se encuentra y de cualquier otra afeccin que padezca. Generalmente, el objetivo del tratamiento es maFamily Dollar Storesiguientes niveles de glucemia durante el embarazo:  En ayunas: igual o menor que 9511ml (5,3mm5ml).  Despus de las comidas (posprandial):  Una hora despus de una comida: igual o menor que 140mg18m(7,8mmol47m.  Dos horas despus de una comida: igual o menor que 120mg/d23m,7mmol/l76m Nivel de A1c (hemoglobinaA1c): del 6% al 6,5%. SIGA ESTAS INDICACIONES EN SU CASA: Preguntas para hacerle al mdico Considere la posibilidad de hacer las siguientes preguntas:  Debo reunirme con un instrRadio broadcast assistant cuidado de la diabetes?  Dnde puedo encontrar un grupo de apoyo para personas diabticas?  Qu equipos necesitar para controlar la diabetes en casa?  Qu medicamentos para la diabetes necesito y cundo debo tomarlos?  Con qu frecuencia debo controlarme la glucemia?  A qu nmero puedo llamar si tengo preguntas?  Cundo es mi prxima cita? Instrucciones generales  Tome losDelphita libre y los recetados solamente como se lo haya indicado el mdico.  Controle el aumento de peso durante el embarazoConnellsvilletidad de peso queWashington Mutualra que aumente depende de su IMC (ndiNorwoodde masa corporal) antes del embarazo.  Concurra a todas las visitas de control como se lo haya indicado el mdico. Esto es importante.  Para obtener ms informacin sobre la diabetes, visite los siguientes sitios:  Asociacin Americana de la Diabetes (American Diabetes Association, ADA): www.diabetes.org  Asociacin Norteamericana de Instructores para el Cuidado de la Diabetes (American Association of Diabetes Educators, AADE): www.diabeteseducator.org/patient-resources COMUNQUESE CON UN MDICO SI:  El nivel de glucemia es igual o superior a 240mg/dl 40m3mmol/l).64ml nivel de glucemia es igual o superior a 200mg/dl (116mmol/l), y71mene cetonas en la orina.  Ha estado enferma o ha tenido fiebre durante 2o ms das y no mejora.  TiePortiauno de los siguientes problemas durante ms de 6horas:  No puede comer ni beber.  Tiene nuseas y vmitos.  Tiene diarrea. SOLICITE AYUDA DE INMEDIATO SI:  Su nivel de glucosa en la sangre est por debajo de 54mg/dl (70m  49mol/l).  Est confundida o tiene dificultad para pensar con claridad.  Tiene dificultad para respirar.  Tiene un nivel moderado o alto de cetonas en la oUte  El beb se mueve menos de lo habitual.  Tiene secreciones inusuales o sangrado de la vagina.  Comienza a tener contracciones antes de tiempo (prematuramente). Las contracciones se pueden sentir como un endurecimiento de la parte inferior del abdomen. Esta informacin no tiene cMarine scientistel consejo del mdico. Asegrese de hacerle al mdico cualquier pregunta que tenga. Document Released: 01/05/2005 Document Revised: 07/20/2015 Document Reviewed: 05/01/2015 Elsevier Interactive Patient Education  2017 EReynolds American

## 2016-08-08 NOTE — Progress Notes (Signed)
New OB Note  08/08/2016   CC:  Chief Complaint  Patient presents with  . Initial Prenatal Visit    Transfer of Care Patient: yes - [facility] due to GDM  History of Present Illness: Victoria Anthony is a 41 y.o. Z6X0960 at [redacted]w[redacted]d by LMP being seen today for her first obstetrical visit. Her obstetrical history is significant for advanced maternal age and gestational diabetes with her third child. Patient does intend to breast feed. Patient plans to do have tubal ligation for contraception after completion of pregnancy (self pay, but will be having repeat cesarean). Pregnancy history fully reviewed.  Her periods were: regular periods every 28 days She was using condoms when she conceived.  She has Negative signs or symptoms of nausea/vomiting of pregnancy. She has Negative signs or symptoms of miscarriage or preterm labor She identifies Negative Zika risk factors for her and her partner  Patient reports no complaints.  Discussed due to diabetes, recommendations for: eye exam, fetal ECHO, diabetes education, and blood glucose monitoring (already started). May have underlying diabetes prior to pregnancy, but does not have a PCP and therefore has not been checked. Recommended after pregnancy to obtain PCP for health.   Any prior children are healthy, doing well, without any problems or issues: yes Complications in prior pregnancies: yes  GDM with second and fourth child, and cesareans x4 Complications in prior deliveries: no  All cesareans   ROS: A 12-point review of systems was performed and negative, except as stated in the above HPI.  HISTORY:  OBGYN History: As per HPI. OB History  Gravida Para Term Preterm AB Living  SAB TAB Ectopic Multiple Live Births          4    # Outcome Date GA Lbr Len/2nd Weight Sex Delivery Anes PTL Lv  5 Current           4 Term 03/23/12 [redacted]w[redacted]d   F CS-LTranv EPI  LIV  3 Term 03/2007 [redacted]w[redacted]d  6 lb 12 oz (3.062 kg) F CS-LTranv   LIV   Birth Comments: ftp  2 Preterm 12/2003 [redacted]w[redacted]d  8 lb 12 oz (3.969 kg) M CS-LTranv  Y LIV     Birth Comments: LGA  GDM  PTB  1 Term 07/2000 [redacted]w[redacted]d  7 lb (3.175 kg) F CS-LTranv   LIV     Birth Comments: nuchal cord,c section due to ftp      Past Medical History: Past Medical History:  Diagnosis Date  . Chest pain   . High cholesterol   . Mental disorder   . No pertinent past medical history   . Vertigo     Past Surgical History: Past Surgical History:  Procedure Laterality Date  . CESAREAN SECTION     X3  . CESAREAN SECTION  03/23/2012   Procedure: CESAREAN SECTION;  Surgeon: Tilda Burrow, MD;  Location: [facility];  Service: Obstetrics;  Laterality: N/A;  . CHOLECYSTECTOMY  ?  . NO PAST SURGERIES      Family History:  Family History  Problem Relation Age of Onset  . Stomach cancer    . Hypertension Mother   . Heart disease Mother   . Stomach cancer Father     She denies any female cancers, bleeding or blood clotting disorders.  She denies any history of mental retardation, birth defects or genetic disorders in her or the FOB's history  Social History:  Social  History   Social History  . Marital status: Single    Spouse name: N/A  . Number of children: 3  . Years of education: N/A   Occupational History  . Not on file.   Social History Main Topics  . Smoking status: Never Smoker  . Smokeless tobacco: Never Used  . Alcohol use No  . Drug use: No  . Sexual activity: Yes    Birth control/ protection: None   Other Topics Concern  . Not on file   Social History Narrative  . No narrative on file   Any pets in the household: no   Allergy: No Known Allergies  Health Maintenance:  Mammogram Up to Date: no will need after delivery (BCCCP program?) Pap Smear Up to date: yes.  Last pap smear 07/26/16. Abnormal: no History of STIs: No   Current Outpatient Medications:   Current Outpatient Prescriptions:  .  Prenatal Multivit-Min-Fe-FA (PRENATAL VITAMINS  PO), Take 1 tablet by mouth daily., Disp: , Rfl:  .  acetaminophen (TYLENOL) 325 MG tablet, Take 325 mg by mouth every 6 (six) hours as needed for pain., Disp: , Rfl:  .  ondansetron (ZOFRAN ODT) 4 MG disintegrating tablet, Take 1 tablet (4 mg total) by mouth every 8 (eight) hours as needed for nausea or vomiting. (Patient not taking: Reported on 08/08/2016), Disp: 20 tablet, Rfl: 0  Physical Exam:   BP 111/66   Pulse 83   Wt 136 lb 12.8 oz (62.1 kg)   LMP 05/02/2016   BMI 28.59 kg/m  Body mass index is 28.59 kg/m. FHTs: 152 bpm  Vitals:   08/08/16 0828  BP: 111/66  Pulse: 83  Weight: 136 lb 12.8 oz (62.1 kg)   Fetal Heart Rate (bpm): 152  Uterus:     Pelvic Exam:  PERFORMED AT [facility]  System: General: well-developed, well-nourished female in no acute distress   Breast:  DEFERRED, PERFORMED AT [facility]   Skin: normal coloration and turgor, no rashes   Neurologic/Psych: Alert, oriented, normal mood and affect, no gross deficits   Extremities: normal strength, tone, and muscle mass, ROM of all joints is normal   HEENT PERRLA, extraocular movement intact and sclera clear, anicteric   Mouth/Teeth mucous membranes moist, pharynx normal without lesions and dental hygiene good   Neck Supple, normal appearance, and no thyromegaly    Cardiovascular: S1, S2 normal, no murmur, rub or gallop, regular rate and rhythm   Respiratory:  Clear to auscultation bilateral. Normal respiratory effort   Abdomen: soft, non-tender; bowel sounds normal; no masses,  no organomegaly; slightly obese; old healed vertical (to umbilicus) and pfannelstiel incision scars    Assessment/Plan: 41 y.o. Z6X0960 [redacted]w[redacted]d  1. Supervision of high risk pregnancy, antepartum - Reviewed records/labs from [facility] - Desires quad screening, did not get FT screen 2/2 missed appt for ultrasound, now too late - Flu Vaccine QUAD 36+ mos IM - [place] MFM OB COMP + 14 WK; Future  2. Gestational diabetes mellitus (GDM) affecting pregnancy -  Reviewed the past week's CBG log, all relatively normal range, a few elevated FBS, but not enough to start medication today. - Type A2/B, early 1hr GTT 193; Getting HbA1c today - Flu Vaccine QUAD 36+ mos IM - [place] MFM OB COMP + 14 WK; Future - Hemoglobin A1C - aspirin EC 81 MG tablet; Take 1 tablet (81 mg total) by mouth daily.  Dispense: 90 tablet; Refill: 4  3. Antepartum multigravida of advanced maternal age - Discussed genetic counseling 2/2  age, patient is self pay, will reach out to MFM for possible financial assistance to get genetic counseling - [place] MFM OB COMP + 14 WK; Future  4. Previous cesarean delivery affecting pregnancy - Previous CS x4. Initial CS in [place], vertical skin incision - Desires tubal at next CS  5. Language barrier affecting health care - Spanish speaking, used interpretor    Initial labs drawn at [facility], HbA1c today Continue prenatal vitamins. Genetic Screening discussed, will give information on genetic counselor/NIPS, but is self pay and Quad screen: requested. Ultrasound discussed; fetal anatomic survey: ordered. Problem list reviewed and updated. The nature of [place] [facility] with multiple MDs and other Advanced Practice Providers was explained to patient; also emphasized that residents, students are part of our team. Routine obstetric precautions reviewed. Return in about 4 weeks (around 09/05/2016) for Routine OB visit.  >50% of 30 min visit spent on counseling and coordination of care.     Cleda Clarks, DO OB Fellow Center for [employer] [facility])

## 2016-08-09 LAB — HEMOGLOBIN A1C
ESTIMATED AVERAGE GLUCOSE: 103 mg/dL
Hgb A1c MFr Bld: 5.2 % (ref 4.8–5.6)

## 2016-08-24 ENCOUNTER — Encounter: Payer: Self-pay | Admitting: *Deleted

## 2016-08-24 ENCOUNTER — Telehealth: Payer: Self-pay | Admitting: Family Medicine

## 2016-08-24 NOTE — Telephone Encounter (Signed)
Called patient to inform of Ultrasound date change. [facility]

## 2016-09-06 ENCOUNTER — Ambulatory Visit (INDEPENDENT_AMBULATORY_CARE_PROVIDER_SITE_OTHER): Payer: Medicaid Other | Admitting: Family Medicine

## 2016-09-06 VITALS — BP 122/69 | HR 79 | Wt 134.5 lb

## 2016-09-06 DIAGNOSIS — O24419 Gestational diabetes mellitus in pregnancy, unspecified control: Secondary | ICD-10-CM

## 2016-09-06 DIAGNOSIS — O0992 Supervision of high risk pregnancy, unspecified, second trimester: Secondary | ICD-10-CM | POA: Diagnosis present

## 2016-09-06 DIAGNOSIS — O34219 Maternal care for unspecified type scar from previous cesarean delivery: Secondary | ICD-10-CM

## 2016-09-06 DIAGNOSIS — O0990 Supervision of high risk pregnancy, unspecified, unspecified trimester: Secondary | ICD-10-CM

## 2016-09-06 DIAGNOSIS — Z789 Other specified health status: Secondary | ICD-10-CM

## 2016-09-06 DIAGNOSIS — O09522 Supervision of elderly multigravida, second trimester: Secondary | ICD-10-CM | POA: Diagnosis not present

## 2016-09-06 LAB — POCT URINALYSIS DIP (DEVICE)
Bilirubin Urine: NEGATIVE
GLUCOSE, UA: NEGATIVE mg/dL
Hgb urine dipstick: NEGATIVE
KETONES UR: NEGATIVE mg/dL
Nitrite: NEGATIVE
PROTEIN: NEGATIVE mg/dL
SPECIFIC GRAVITY, URINE: 1.02 (ref 1.005–1.030)
Urobilinogen, UA: 1 mg/dL (ref 0.0–1.0)
pH: 7 (ref 5.0–8.0)

## 2016-09-06 NOTE — Progress Notes (Signed)
      Subjective:  Victoria Anthony is a 41 y.o. [id number] at 6942w1d being seen today for ongoing prenatal care.  She is currently monitored for the following issues for this high-risk pregnancy and has Lack of education; Supervision of high risk pregnancy, antepartum; AMA (advanced maternal age) multigravida 35+; Gestational diabetes mellitus (GDM) affecting pregnancy; Previous cesarean delivery affecting pregnancy; and Language barrier affecting health care on her problem list.  Patient reports no complaints.  Contractions: Not present. Vag. Bleeding: None.  Movement: Present. Denies leaking of fluid.   The following portions of the patient's history were reviewed and updated as appropriate: allergies, current medications, past family history, past medical history, past social history, past surgical history and problem list. Problem list updated.  Objective:   Vitals:   09/06/16 1042  BP: 122/69  Pulse: 79  Weight: 134 lb 8 oz (61 kg)    Fetal Status: Fetal Heart Rate (bpm): 140   Movement: Present      General:  Alert, oriented and cooperative. Patient is in no acute distress.  Skin: Skin is warm and dry. No rash noted.   Cardiovascular: Normal heart rate noted  Respiratory: Normal respiratory effort, no problems with respiration noted  Abdomen: Soft, gravid, appropriate for gestational age. Pain/Pressure: Present     Pelvic:  Cervical exam deferred        Extremities: Normal range of motion.  Edema: None  Mental Status: Normal mood and affect. Normal behavior. Normal judgment and thought content.   Urinalysis: Urine Protein: Negative Urine Glucose: Negative         Assessment and Plan:  Pregnancy: [id number] at 9442w1d  1. Supervision of high risk pregnancy, antepartum - GDM B due to early diagnosis with 3 hr - Taking ASA 81mg  daily - Fetal ECHO scheduled 7/26 at 1PM - Anatomy scan scheduled  2. Gestational diabetes mellitus (GDM) affecting pregnancy - Fairly  controlled, continue monitoring, not adding medications now (see above), consider metformin if continues to be borderline controlled.  3. Elderly multigravida in second trimester  4. Previous cesarean delivery affecting pregnancy - Repeat to be scheduled  5. Language barrier affecting health care - Spanish speaking, interpretor present   MOC: Desires BTL, just got medicaid, needs confirmation of medicaid card on file, sign consent by 28 weeks. MOF: breastfeeding Preterm labor symptoms and general obstetric precautions including but not limited to vaginal bleeding, contractions, leaking of fluid and fetal movement were reviewed in detail with the patient. Please refer to After Visit Summary for other counseling recommendations.  Return in about 4 weeks (around 10/04/2016) for Routine OB visit.   Jen MowElizabeth Mumaw, DO OB Fellow [facility], [facility]

## 2016-09-06 NOTE — Progress Notes (Signed)
Patient complains of feeling nausea, anxiety,dizzy,hot and her hands were very sweaty on 5/27 at 6am that last for only 10 minutes. Patient states the dizziness is happens a lot.  Patient also experienced some white watery discharge running down her legs when she was going to shower it only happened one time during this pregnancy.

## 2016-09-06 NOTE — Patient Instructions (Signed)
Diabetes mellitus gestacional, cuidados personales (Gestational Diabetes Mellitus, Self Care) El cuidado personal despus del diagnstico de diabetes gestacional (diabetes mellitus gestacional) implica mantener el nivel de glucosa en la sangre bajo control a travs del equilibrio de los siguientes factores:  Nutricin.  Actividad fsica.  Cambios en el estilo de vida.  Medicamentos o insulina, si es necesario.  El apoyo del equipo de mdicos y de otras personas. La siguiente informacin explica lo que debe saber para mantener la diabetes gestacional bajo control en su casa. QU DEBO SABER PARA MANTENER LA GLUCEMIA BAJO CONTROL?  Contrlese la glucemia todos los das durante el embarazo. Haga esto con la frecuencia que le haya indicado el mdico.  Comunquese con el mdico si la glucemia est por encima del nivel ideal en 2anlisis seguidos. El mdico establecer los objetivos personalizados de su tratamiento. Generalmente, el objetivo del tratamiento es mantener los siguientes niveles de glucemia durante el embarazo:  Despus de no haber comido durante 8horas (despus de ayunar): igual o menor que 95mg/dl (5,3mmol/l).  Despus de las comidas (posprandial):  Una hora despus de una comida: igual o menor que 140mg/dl (7,8mmol/l).  Dos horas despus de una comida: igual o menor que 120mg/dl (6,7mmol/l).  Nivel de A1c (hemoglobinaA1c): del 6% al 6,5%. QU DEBO SABER SOBRE LA HIPERGLUCEMIA Y LA HIPOGLUCEMIA? Qu es la hiperglucemia?  La hiperglucemia, tambin llamada glucemia alta, ocurre cuando el nivel de glucosa en la sangre es muy elevado. Asegrese de conocer los signos tempranos de hiperglucemia, por ejemplo:  Aumento de la sed.  Hambre.  Mucho cansancio.  Necesidad de orinar con mayor frecuencia que lo habitual.  Visin borrosa. Qu es la hipoglucemia?  La hipoglucemia, tambin llamada glucemia baja, ocurre cuando el nivel de glucosa en la sangre es igual  o menor que 70mg/dl (3,9mmol/l). El riesgo de hipoglucemia aumenta durante o despus de realizar actividad fsica, mientras duerme, cuando est enfermo o si se saltea comidas o no come durante mucho tiempo (ayuna). Es importante conocer los sntomas de la hipoglucemia y tratarla de inmediato. Lleve siempre consigo una colacin de 15gramos hidratos de carbono de accin rpida para tratar la glucemia baja.Los familiares y los amigos cercanos tambin deben conocer los sntomas, y comprender cmo tratar la hipoglucemia, en caso de que usted no pueda tratarse a s mismo. Cules son los sntomas de la hipoglucemia?  Los sntomas de hipoglucemia pueden incluir los siguientes:  Hambre.  Ansiedad.  Sudoracin y piel hmeda.  Confusin.  Mareos o sensacin de desvanecimiento.  Somnolencia.  Nuseas.  Aumento de la frecuencia cardaca.  Dolor de cabeza.  Visin borrosa.  Convulsiones.  Pesadillas.  Hormigueo o adormecimiento alrededor de la boca, los labios o la lengua.  Cambios en el habla.  Disminucin de la capacidad de concentracin.  Cambios en la coordinacin.  Sueo agitado.  Temblores o sacudidas.  Desmayos.  Irritabilidad. Cmo se trata la hipoglucemia?  Si est alerta y puede tragar con seguridad, siga la regla de 15/15, que consiste en lo siguiente:  Tome 15gramos de hidratos de carbono de accin rpida. Las opciones de accin rpida incluyen lo siguiente:  1pomo de glucosa en gel.  3comprimidos de glucosa.  6 a 8unidades de caramelos duros.  4onzas (120ml) de jugo de frutas.  4onzas (120ml) de gaseosa comn (no diettica).  Contrlese la glucemia 15minutos despus de ingerir el hidrato de carbono.  Si este nuevo nivel de glucosa en la sangre an es de 70mg/dl o menor (3,9mmol/l), vuelva a ingerir 15gramos de   un hidrato de carbono.  Si la glucemia no aumenta por encima de 70mg/dl (3,9mmol/l) despus de 3intentos, solicite ayuda mdica  de emergencia.  Ingiera una comida o una colacin en el transcurso de 1hora despus de que la glucemia se haya normalizado. Cmo se trata la hipoglucemia grave?  La hipoglucemia grave ocurre cuando la glucemia es igual o menor que 54mg/dl (3mmol/l). La hipoglucemia grave es una emergencia. No espere hasta que los sntomas desaparezcan. Solicite atencin mdica de inmediato. Comunquese con el servicio de emergencias de su localidad (911 en los [place]). No conduzca por sus propios medios hasta el hospital. Si tiene hipoglucemia grave y no puede ingerir alimentos o bebidas, tal vez deba aplicarse una inyeccin de glucagn. Un familiar o un amigo cercano deben aprender a controlarle la glucemia y a aplicarle una inyeccin de glucagn. Pregntele al mdico si debe tener disponible un kit de inyecciones de glucagn de emergencia. Es posible que la hipoglucemia grave deba tratarse en un hospital. El tratamiento puede incluir la administracin de glucosa a travs de una va intravenosa (IV). Tambin puede necesitar un tratamiento para tratar la afeccin que est causando la hipoglucemia. QU OTRAS COSAS PUEDO HACER PARA CONTROLAR LA DIABETES GESTACIONAL? Tome los medicamentos para la diabetes como se lo hayan indicado   Si el mdico le recet insulina o medicamentos para la diabetes, tmelos todos los das.  No se quede sin insulina ni cualquier otro medicamento para la diabetes que tome. Planifique con antelacin para tenerlos siempre a su disposicin.  Si usa insulina, ajuste las dosis en funcin de la cantidad de actividad fsica que realiza y de los alimentos que consume. El mdico le indicar cmo ajustar las dosis. Opte por opciones de alimentos saludables  Lo que come y bebe incide en la glucemia. Hacer buenas elecciones ayuda a mantener la diabetes bajo control y a evitar otros problemas de salud. Un plan de alimentacin saludable incluye consumir protenas magras, hidratos de carbono  complejos, frutas y verduras frescas, productos lcteos con bajo contenido de grasa y grasas saludables. Programe una cita con un especialista en alimentacin y nutricin (nutricionista certificado) para que la ayude a armar un plan de alimentacin adecuado para usted. Asegrese de lo siguiente:  Siga las indicaciones del mdico respecto de las restricciones para las comidas o las bebidas.  Beba suficiente lquido para mantener la orina clara o de color amarillo plido.  Ingiera colaciones saludables entre comidas nutritivas.  Haga un seguimiento de los hidratos de carbono que consume. Para hacerlo, lea las etiquetas de informacin nutricional y aprenda cules son los tamaos de las porciones estndar de los alimentos.  Siga el plan para los das de enfermedad cuando no pueda comer o beber normalmente. Arme este plan por adelantado con el mdico. Mantngase activa   Haga al menos 30minutos de actividad fsica por da, o tanta actividad fsica como le recomiende el mdico durante el embarazo.  Hacer 10minutos de actividad fsica 30minutos despus de cada comida puede ayudar a controlar los niveles de glucemia posprandial.  Si comienza un ejercicio o una actividad nuevos, trabaje con el mdico para ajustar la insulina, los medicamentos o la ingesta de comidas segn sea necesario. Opte por un estilo de vida saludable   No beba alcohol.  No consuma ningn producto que contenga tabaco, lo que incluye cigarrillos, tabaco de mascar y cigarrillos electrnicos. Si necesita ayuda para dejar de fumar, consulte al mdico.  Aprenda a manejar el estrs. Si necesita ayuda para lograrlo, consulte a   su mdico. Cuide su cuerpo   Mantngase al da con las vacunas.  Cepllese los dientes y las encas dos veces al da y use hilo dental al menos una vez por da.  Visite al dentista al menos una vez cada 6meses.  Mantenga un peso saludable durante el embarazo. Instrucciones generales   Tome los  medicamentos de venta libre y los recetados solamente como se lo haya indicado el mdico.  Hable con el mdico sobre el riesgo de tener hipertensin arterial durante el embarazo (preeclampsia o eclampsia).  Comparta su plan de control de la diabetes con sus compaeros de trabajo y de la escuela, y con las personas con las que convive.  Controle el nivel de cetonas en la orina durante el embarazo cuando est enferma o como se lo haya indicado el mdico.  Lleve una tarjeta de alerta mdica o use un brazalete o medalla de alerta mdica.  Pregntele al mdico:  Debo reunirme con un instructor para el cuidado de la diabetes?  Dnde puedo encontrar un grupo de apoyo para personas diabticas?  Asista a todas las visitas de control durante el embarazo (prenatal) y despus del parto (posnatal) como se lo haya indicado el mdico. Esto es importante. Procure recibir la atencin que necesita despus del parto   Hgase controlar la glucemia de 4a 12semanas despus del parto. Esto se hace con una prueba de tolerancia a la glucosa oral (PTGO).  Hgase controles de deteccin de la diabetes al menos cada 3aos o con la frecuencia que le haya indicado el mdico. DNDE ENCONTRAR MS INFORMACIN: Para obtener ms informacin sobre la diabetes gestacional, visite los siguientes sitios:  Asociacin Americana de la Diabetes (American Diabetes Association, ADA): www.diabetes.org/diabetes-basics/gestational  Centros para el Control y la Prevencin de Enfermedades (Centers for Disease Control and Prevention, CDC): www.cdc.gov/diabetes/pubs/pdf/gestationalDiabetes.pdf Esta informacin no tiene como fin reemplazar el consejo del mdico. Asegrese de hacerle al mdico cualquier pregunta que tenga. Document Released: 07/20/2015 Document Revised: 07/20/2015 Document Reviewed: 05/01/2015 Elsevier Interactive Patient Education  2017 Elsevier Inc.  

## 2016-10-05 ENCOUNTER — Ambulatory Visit (INDEPENDENT_AMBULATORY_CARE_PROVIDER_SITE_OTHER): Payer: Self-pay | Admitting: Obstetrics and Gynecology

## 2016-10-05 VITALS — BP 119/73 | HR 89 | Wt 135.5 lb

## 2016-10-05 DIAGNOSIS — O24419 Gestational diabetes mellitus in pregnancy, unspecified control: Secondary | ICD-10-CM

## 2016-10-05 DIAGNOSIS — O09522 Supervision of elderly multigravida, second trimester: Secondary | ICD-10-CM

## 2016-10-05 DIAGNOSIS — Z789 Other specified health status: Secondary | ICD-10-CM

## 2016-10-05 DIAGNOSIS — O0992 Supervision of high risk pregnancy, unspecified, second trimester: Secondary | ICD-10-CM

## 2016-10-05 DIAGNOSIS — O0990 Supervision of high risk pregnancy, unspecified, unspecified trimester: Secondary | ICD-10-CM

## 2016-10-05 NOTE — Progress Notes (Signed)
Patient complains of nausea. Advised patient that there is a prescription at the pharmacy for Zofran (patient didn't know)  for her to pick up and try to see if that help with her nausea.

## 2016-10-05 NOTE — Progress Notes (Signed)
   PRENATAL VISIT NOTE  Subjective:  Victoria Anthony is a 41 y.o. [id number] at 3243w2d being seen today for ongoing prenatal care.  She is currently monitored for the following issues for this high-risk pregnancy and has Lack of education; Supervision of high risk pregnancy, antepartum; AMA (advanced maternal age) multigravida 35+; Gestational diabetes mellitus (GDM) affecting pregnancy; Previous cesarean delivery affecting pregnancy; and Language barrier affecting health care on her problem list.  Patient reports no complaints.  Contractions: Irritability. Vag. Bleeding: None.  Movement: Present. Denies leaking of fluid.   The following portions of the patient's history were reviewed and updated as appropriate: allergies, current medications, past family history, past medical history, past social history, past surgical history and problem list. Problem list updated.  Objective:   Vitals:   10/05/16 1256  BP: 119/73  Pulse: 89  Weight: 135 lb 8 oz (61.5 kg)    Fetal Status: Fetal Heart Rate (bpm): 137 Fundal Height: 23 cm Movement: Present     General:  Alert, oriented and cooperative. Patient is in no acute distress.  Skin: Skin is warm and dry. No rash noted.   Cardiovascular: Normal heart rate noted  Respiratory: Normal respiratory effort, no problems with respiration noted  Abdomen: Soft, gravid, appropriate for gestational age. Pain/Pressure: Absent     Pelvic:  Cervical exam deferred        Extremities: Normal range of motion.  Edema: None  Mental Status: Normal mood and affect. Normal behavior. Normal judgment and thought content.   Assessment and Plan:  Pregnancy: [id number] at 2543w2d  1. Gestational diabetes mellitus (GDM) affecting pregnancy CBGs reviewed and all values within range. Encouraged the patient to continue with her diet and daily walks Continue diet control  2. Language barrier affecting health care Spanish interpreter used  3. Supervision of high risk  pregnancy, antepartum Patient is doing well Considering quad screen but will determine if she is medicaid eligible first. She plans to return next week for quad screen only Anatomy ultrasound performed at the health department- records requested but not available for review at today's visit  4. Elderly multigravida in second trimester Considering quad screen  Preterm labor symptoms and general obstetric precautions including but not limited to vaginal bleeding, contractions, leaking of fluid and fetal movement were reviewed in detail with the patient. Please refer to After Visit Summary for other counseling recommendations.  Return in about 3 weeks (around 10/26/2016) for ROB.   Catalina AntiguaPeggy Arora Coakley, MD

## 2016-10-24 ENCOUNTER — Ambulatory Visit (INDEPENDENT_AMBULATORY_CARE_PROVIDER_SITE_OTHER): Payer: Self-pay | Admitting: Obstetrics and Gynecology

## 2016-10-24 VITALS — BP 112/76 | HR 86 | Wt 136.0 lb

## 2016-10-24 DIAGNOSIS — Z3009 Encounter for other general counseling and advice on contraception: Secondary | ICD-10-CM | POA: Insufficient documentation

## 2016-10-24 DIAGNOSIS — O34219 Maternal care for unspecified type scar from previous cesarean delivery: Secondary | ICD-10-CM

## 2016-10-24 DIAGNOSIS — O0990 Supervision of high risk pregnancy, unspecified, unspecified trimester: Secondary | ICD-10-CM

## 2016-10-24 DIAGNOSIS — Z789 Other specified health status: Secondary | ICD-10-CM

## 2016-10-24 DIAGNOSIS — O360920 Maternal care for other rhesus isoimmunization, second trimester, not applicable or unspecified: Secondary | ICD-10-CM

## 2016-10-24 DIAGNOSIS — O24419 Gestational diabetes mellitus in pregnancy, unspecified control: Secondary | ICD-10-CM

## 2016-10-24 DIAGNOSIS — O09522 Supervision of elderly multigravida, second trimester: Secondary | ICD-10-CM

## 2016-10-24 DIAGNOSIS — O0992 Supervision of high risk pregnancy, unspecified, second trimester: Secondary | ICD-10-CM

## 2016-10-24 DIAGNOSIS — O360990 Maternal care for other rhesus isoimmunization, unspecified trimester, not applicable or unspecified: Secondary | ICD-10-CM | POA: Insufficient documentation

## 2016-10-24 NOTE — Progress Notes (Signed)
Subjective:  Victoria Anthony is a 41 y.o. [id number] at 5173w0d being seen today for ongoing prenatal care.  She is currently monitored for the following issues for this high-risk pregnancy and has Lack of education; Supervision of high risk pregnancy, antepartum; AMA (advanced maternal age) multigravida 35+; Gestational diabetes mellitus (GDM) affecting pregnancy; Previous cesarean delivery affecting pregnancy; Language barrier affecting health care; Unwanted fertility; and Rh alloimmunization, maternal, antepartum on her problem list.  Patient reports insect bites..  Contractions: Not present. Vag. Bleeding: None.  Movement: Present. Denies leaking of fluid.   The following portions of the patient's history were reviewed and updated as appropriate: allergies, current medications, past family history, past medical history, past social history, past surgical history and problem list. Problem list updated.  Objective:   Vitals:   10/24/16 1459  BP: 112/76  Pulse: 86  Weight: 136 lb (61.7 kg)    Fetal Status: Fetal Heart Rate (bpm): 144   Movement: Present     General:  Alert, oriented and cooperative. Patient is in no acute distress.  Skin: Skin is warm and dry. No rash noted.   Cardiovascular: Normal heart rate noted  Respiratory: Normal respiratory effort, no problems with respiration noted  Abdomen: Soft, gravid, appropriate for gestational age. Pain/Pressure: Absent     Pelvic:  Cervical exam deferred        Extremities: Normal range of motion.  Edema: None  Mental Status: Normal mood and affect. Normal behavior. Normal judgment and thought content.   Urinalysis:      Assessment and Plan:  Pregnancy: [id number] at 1573w0d  1. Language barrier affecting health care Interrupter services used  2. Previous cesarean delivery affecting pregnancy Repeat at 39 weeks  3. Gestational diabetes mellitus (GDM) affecting pregnancy BS in goal range without meds   4. Supervision of high risk  pregnancy, antepartum Stable  5. Unwanted fertility Still working on Medicaid  6. Elderly multigravida in second trimester To late for Quad screen  7. Rhesus isoimmunization during pregnancy, antepartum, single or unspecified fetus Rhogam next visit  Preterm labor symptoms and general obstetric precautions including but not limited to vaginal bleeding, contractions, leaking of fluid and fetal movement were reviewed in detail with the patient. Please refer to After Visit Summary for other counseling recommendations.  Return in about 3 weeks (around 11/14/2016) for OB visit.   Hermina StaggersErvin, Brinleigh Tew L, MD

## 2016-10-24 NOTE — Progress Notes (Signed)
Patient has a lot of mosquito bites, I advised that she use a hydrocortisone cream.

## 2016-11-15 ENCOUNTER — Ambulatory Visit (INDEPENDENT_AMBULATORY_CARE_PROVIDER_SITE_OTHER): Payer: Self-pay | Admitting: Obstetrics & Gynecology

## 2016-11-15 DIAGNOSIS — O0990 Supervision of high risk pregnancy, unspecified, unspecified trimester: Secondary | ICD-10-CM

## 2016-11-15 DIAGNOSIS — O0993 Supervision of high risk pregnancy, unspecified, third trimester: Secondary | ICD-10-CM

## 2016-11-15 DIAGNOSIS — Z23 Encounter for immunization: Secondary | ICD-10-CM

## 2016-11-15 NOTE — Progress Notes (Signed)
Needs to reschedule echocardiogram    PRENATAL VISIT NOTE  Subjective:  Victoria Anthony is a 41 y.o. [id number] at 2521w1d being seen today for ongoing prenatal care.  She is currently monitored for the following issues for this high-risk pregnancy and has Lack of education; Supervision of high risk pregnancy, antepartum; AMA (advanced maternal age) multigravida 35+; Gestational diabetes mellitus (GDM) affecting pregnancy; Previous cesarean delivery affecting pregnancy; Language barrier affecting health care; Unwanted fertility; and Rh alloimmunization, maternal, antepartum on her problem list.  Patient reports no complaints.  Contractions: Not present. Vag. Bleeding: None.  Movement: Present. Denies leaking of fluid.   The following portions of the patient's history were reviewed and updated as appropriate: allergies, current medications, past family history, past medical history, past social history, past surgical history and problem list. Problem list updated.  Objective:   Vitals:   11/15/16 1613  BP: 110/63  Pulse: 87    Fetal Status: Fetal Heart Rate (bpm): 139   Movement: Present     General:  Alert, oriented and cooperative. Patient is in no acute distress.  Skin: Skin is warm and dry. No rash noted.   Cardiovascular: Normal heart rate noted  Respiratory: Normal respiratory effort, no problems with respiration noted  Abdomen: Soft, gravid, appropriate for gestational age.  Pain/Pressure: Present     Pelvic: Cervical exam deferred        Extremities: Normal range of motion.  Edema: None  Mental Status:  Normal mood and affect. Normal behavior. Normal judgment and thought content.   Assessment and Plan:  Pregnancy: [id number] at 3521w1d  1. Supervision of high risk pregnancy, antepartum Routine labs - US MFM OB DETAIL +14 WK; Future - RPR - HIV antibody - Hepatitis B surface antigen - CBC Gestational diabetes, echo and f/u [place]. Good diet control values reviewed today Preterm  labor symptoms and general obstetric precautions including but not limited to vaginal bleeding, contractions, leaking of fluid and fetal movement were reviewed in detail with the patient. Please refer to After Visit Summary for other counseling recommendations.  Return in about 2 weeks (around 11/29/2016).   Scheryl DarterJames Jaziel Bennett, MD

## 2016-11-15 NOTE — Patient Instructions (Signed)

## 2016-11-16 LAB — CBC
Hematocrit: 36.4 % (ref 34.0–46.6)
Hemoglobin: 12.1 g/dL (ref 11.1–15.9)
MCH: 29.2 pg (ref 26.6–33.0)
MCHC: 33.2 g/dL (ref 31.5–35.7)
MCV: 88 fL (ref 79–97)
PLATELETS: 210 10*3/uL (ref 150–379)
RBC: 4.14 x10E6/uL (ref 3.77–5.28)
RDW: 14.1 % (ref 12.3–15.4)
WBC: 8.9 10*3/uL (ref 3.4–10.8)

## 2016-11-16 LAB — RPR: RPR: NONREACTIVE

## 2016-11-16 LAB — HEPATITIS B SURFACE ANTIGEN: Hepatitis B Surface Ag: NEGATIVE

## 2016-11-16 LAB — HIV ANTIBODY (ROUTINE TESTING W REFLEX): HIV SCREEN 4TH GENERATION: NONREACTIVE

## 2016-11-17 ENCOUNTER — Other Ambulatory Visit (HOSPITAL_COMMUNITY): Payer: Self-pay | Admitting: *Deleted

## 2016-11-17 ENCOUNTER — Encounter (HOSPITAL_COMMUNITY): Payer: Self-pay

## 2016-11-17 ENCOUNTER — Ambulatory Visit (HOSPITAL_COMMUNITY)
Admission: RE | Admit: 2016-11-17 | Discharge: 2016-11-17 | Disposition: A | Discharge status: Home / Self Care | Payer: Self-pay | Source: Ambulatory Visit | Attending: Obstetrics & Gynecology | Admitting: Obstetrics & Gynecology

## 2016-11-17 DIAGNOSIS — O24419 Gestational diabetes mellitus in pregnancy, unspecified control: Secondary | ICD-10-CM

## 2016-11-17 DIAGNOSIS — Z3A28 28 weeks gestation of pregnancy: Secondary | ICD-10-CM | POA: Insufficient documentation

## 2016-11-17 DIAGNOSIS — O0990 Supervision of high risk pregnancy, unspecified, unspecified trimester: Secondary | ICD-10-CM | POA: Insufficient documentation

## 2016-11-17 NOTE — Addendum Note (Signed)
Encounter addended by: Marcellina MillinMoser, Suly Vukelich L, RTR on: 11/17/2016 11:53 AM<BR>    Actions taken: Imaging Exam ended

## 2016-11-30 ENCOUNTER — Ambulatory Visit (INDEPENDENT_AMBULATORY_CARE_PROVIDER_SITE_OTHER): Payer: Self-pay | Admitting: Obstetrics & Gynecology

## 2016-11-30 ENCOUNTER — Encounter (HOSPITAL_COMMUNITY): Payer: Self-pay

## 2016-11-30 DIAGNOSIS — O0993 Supervision of high risk pregnancy, unspecified, third trimester: Secondary | ICD-10-CM

## 2016-11-30 DIAGNOSIS — O0990 Supervision of high risk pregnancy, unspecified, unspecified trimester: Secondary | ICD-10-CM

## 2016-11-30 NOTE — Patient Instructions (Signed)
Parto por cesrea (Cesarean Delivery) El nacimiento o parto por cesrea es el parto quirrgico de un beb a travs de una incisin en el abdomen y el tero. Se lo puede llamar incisin cesrea. Este procedimiento se puede programar con anticipacin o se puede realizar en una situacin de emergencia. INFORME A SU MDICO:  Cualquier alergia que tenga.  Todos los medicamentos que utiliza, incluidos vitaminas, hierbas, gotas oftlmicas, cremas y medicamentos de venta libre.  Problemas previos que usted o los miembros de su familia hayan tenido con el uso de anestsicos.  Enfermedades de la sangre que tenga.  Si tiene cirugas previas.  Cualquier enfermedad que tenga.  Si usted o algn familiar tiene antecedentes de trombosis venosa profunda (TVP) o embolia pulmonar (EP).  RIESGOS Y COMPLICACIONES En general, se trata de un procedimiento seguro. Sin embargo, pueden ocurrir complicaciones, por ejemplo:  Infeccin.  Hemorragia.  Reacciones alrgicas a los medicamentos.  Daos a otras estructuras u otros rganos.  Cogulos sanguneos.  Lesiones al beb. ANTES DEL PROCEDIMIENTO  Siga las indicaciones del mdico respecto de las restricciones para las comidas o las bebidas.  Siga las indicaciones del mdico respecto de baarse antes del procedimiento para ayudar a reducir el riesgo de infeccin.  Si sabe que va a tener un parto por cesrea, no se rasure la zona pbica. Rasurarse antes del procedimiento puede aumentar el riesgo de infeccin.  Consulte al mdico si debe hacer o no lo siguiente: ? Cambiar o suspender los medicamentos que toma habitualmente. Esto es muy importante si toma medicamentos para la diabetes o anticoagulantes. ? El plan para controlar el dolor. Esto es muy importante si piensa amamantar a su beb. ? Cunto tiempo permanecer en el hospital despus del procedimiento. ? Cualquier inquietud que pueda tener sobre recibir hemoderivados si los necesita durante el  procedimiento. ? Almacenamiento de sangre del cordn, si planea guardar la sangre del cordn umbilical del beb.  Quiz tambin desee preguntarle al mdico lo siguiente: ? Si va a poder sostener o amamantar a su beb mientras an se encuentre en el quirfano. ? Si su beb puede quedarse con usted inmediatamente despus del procedimiento y durante su recuperacin. ? Si un familiar o la persona que usted elija puede ingresar al quirfano y permanecer con usted durante el procedimiento, inmediatamente despus de este y durante su recuperacin.  Haga arreglos para que alguien la lleve a su casa cuando le den el alta hospitalaria.  PROCEDIMIENTO  Se le colocarn monitores fetales en el abdomen para controlar la frecuencia cardaca de su beb y la suya.  Segn el motivo del parto por cesrea, es posible que se le realice un examen fsico o una prueba adicional, como una ecografa.  Le colocarn una va intravenosa (IV) en una de las venas.  Posiblemente le realicen un anlisis de sangre u orina.  Se le administrarn antibiticos para ayudar a prevenir las infecciones.  Se le puede entregar una bata especial de calentamiento para mantener estable la temperatura corporal.  Pueden rasurarle a zona pbica.  Le limpiarn la piel de la zona pbica y de la parte inferior del abdomen con una solucin para destruir las bacterias (antisptico).  Se le puede insertar un catter en la vejiga a travs de la uretra. Esto se hace para drenar la orina durante el procedimiento.  Pueden administrarle uno o ms de los siguientes medicamentos: ? Un medicamento para adormecer la zona (anestesia local). ? Un medicamento que lo har dormir (anestesia general). ? Un medicamento (  anestesia regional) que se le inyecta en la espalda o a travs de un tubo pequeo y delgado que se le coloca en la espalda (anestesia raqudea o anestesia epidural). Esto adormece la parte del cuerpo que est por debajo del lugar de la  inyeccin y le permite permanecer despierta durante el procedimiento. Si llega a sentir nuseas, dgaselo al mdico. Tendr medicamentos disponibles para ayudarla a reducir cualquier nusea que pueda sentir.  Le harn una incisin en el abdomen y luego en el tero.  Si est despierta durante el procedimiento, puede sentir tirones en el abdomen, pero no debera sentir dolor. Si siente dolor, dgaselo al mdico inmediatamente.  Se sacar al beb del tero. Es posible que sienta ms presin o un tirn, mientras esto sucede.  Inmediatamente despus del parto, se secar al beb y se lo mantendr caliente. Podr sostener y amamantar a su beb. Durante este momento, es posible que se pince y corte el cordn umbilical.  Se le extraer la placenta del tero.  Las incisiones se cerrarn con puntos (suturas). Es posible que se apliquen grapas, pegamento para la piel o tiras adhesivas en la incisin del abdomen.  Se colocarn vendas (vendajes) sobre la incisin del abdomen. Este procedimiento puede variar segn el mdico y el hospital. DESPUS DEL PROCEDIMIENTO  Le controlarn con frecuencia la presin arterial, la frecuencia cardaca, la frecuencia respiratoria y el nivel de oxgeno en la sangre hasta que haya desaparecido el efecto de los medicamentos administrados.  Puede seguir recibiendo lquidos o medicamentos por la va intravenosa.  Sentir un poco de dolor. Tendr analgsicos disponibles para ayudarla a controlar el dolor.  Para evitar la formacin de cogulos sanguneos: ? Se le pueden administrar medicamentos. ? Quizs deba usar medias o dispositivos de compresin. ? Se le indicar que camine cuando pueda hacerlo.  El personal del hospital alentar y apoyar que cree lazos con su beb. En el hospital, se le puede permitir que el beb permanezca en la misma habitacin que usted (internacin conjunta) durante la hospitalizacin para fomentar un amamantamiento exitoso.  Se le puede sugerir  que tosa y respire profundamente con frecuencia. Esto ayuda a evitar problemas pulmonares.  Si tiene un catter que le drena la orina, se le quitar lo antes posible despus del procedimiento.  Esta informacin no tiene como fin reemplazar el consejo del mdico. Asegrese de hacerle al mdico cualquier pregunta que tenga. Document Released: 03/28/2005 Document Revised: 07/20/2015 Document Reviewed: 01/06/2015 Elsevier Interactive Patient Education  2017 [employer]  

## 2016-11-30 NOTE — Progress Notes (Signed)
   PRENATAL VISIT NOTE  Subjective:  Victoria Anthony is a 41 y.o. [id number] at [redacted]w[redacted]d being seen today for ongoing prenatal care.  She is currently monitored for the following issues for this high-risk pregnancy and has Lack of education; Supervision of high risk pregnancy, antepartum; AMA (advanced maternal age) multigravida 35+; Gestational diabetes mellitus (GDM) affecting pregnancy; Previous cesarean delivery affecting pregnancy; Language barrier affecting health care; Unwanted fertility; and Rh alloimmunization, maternal, antepartum on her problem list.  Patient reports no complaints.  Contractions: Not present.  .  Movement: Present. Denies leaking of fluid.   The following portions of the patient's history were reviewed and updated as appropriate: allergies, current medications, past family history, past medical history, past social history, past surgical history and problem list. Problem list updated.  Objective:   Vitals:   11/30/16 1414  BP: 130/71  Pulse: 72  Weight: 137 lb (62.1 kg)    Fetal Status: Fetal Heart Rate (bpm): 166   Movement: Present     General:  Alert, oriented and cooperative. Patient is in no acute distress.  Skin: Skin is warm and dry. No rash noted.   Cardiovascular: Normal heart rate noted  Respiratory: Normal respiratory effort, no problems with respiration noted  Abdomen: Soft, gravid, appropriate for gestational age.  Pain/Pressure: Present     Pelvic: Cervical exam deferred        Extremities: Normal range of motion.  Edema: None  Mental Status:  Normal mood and affect. Normal behavior. Normal judgment and thought content.   Assessment and Plan:  Pregnancy: [id number] at [redacted]w[redacted]d  1. Supervision of high risk pregnancy, antepartum All BG in range  Preterm labor symptoms and general obstetric precautions including but not limited to vaginal bleeding, contractions, leaking of fluid and fetal movement were reviewed in detail with the patient. Please  refer to After Visit Summary for other counseling recommendations.  Return in about 2 weeks (around 12/14/2016).   Scheryl Darter, MD

## 2016-12-13 ENCOUNTER — Encounter (HOSPITAL_COMMUNITY): Payer: Self-pay

## 2016-12-15 ENCOUNTER — Encounter (HOSPITAL_COMMUNITY): Payer: Self-pay

## 2016-12-15 ENCOUNTER — Other Ambulatory Visit (HOSPITAL_COMMUNITY): Payer: Self-pay | Admitting: *Deleted

## 2016-12-15 ENCOUNTER — Ambulatory Visit (HOSPITAL_COMMUNITY)
Admission: RE | Admit: 2016-12-15 | Discharge: 2016-12-15 | Disposition: A | Discharge status: Home / Self Care | Payer: Self-pay | Source: Ambulatory Visit | Attending: Obstetrics & Gynecology | Admitting: Obstetrics & Gynecology

## 2016-12-15 ENCOUNTER — Other Ambulatory Visit (HOSPITAL_COMMUNITY): Payer: Self-pay | Admitting: Obstetrics and Gynecology

## 2016-12-15 DIAGNOSIS — O09523 Supervision of elderly multigravida, third trimester: Secondary | ICD-10-CM

## 2016-12-15 DIAGNOSIS — O24419 Gestational diabetes mellitus in pregnancy, unspecified control: Secondary | ICD-10-CM

## 2016-12-15 DIAGNOSIS — O34219 Maternal care for unspecified type scar from previous cesarean delivery: Secondary | ICD-10-CM

## 2016-12-15 DIAGNOSIS — Z3A30 30 weeks gestation of pregnancy: Secondary | ICD-10-CM

## 2016-12-15 DIAGNOSIS — O24410 Gestational diabetes mellitus in pregnancy, diet controlled: Secondary | ICD-10-CM | POA: Insufficient documentation

## 2016-12-15 DIAGNOSIS — O24420 Gestational diabetes mellitus in childbirth, diet controlled: Secondary | ICD-10-CM

## 2016-12-19 ENCOUNTER — Ambulatory Visit (INDEPENDENT_AMBULATORY_CARE_PROVIDER_SITE_OTHER): Payer: Self-pay | Admitting: Family Medicine

## 2016-12-19 ENCOUNTER — Encounter: Payer: Self-pay | Admitting: Family Medicine

## 2016-12-19 VITALS — BP 103/72 | HR 86 | Wt 134.8 lb

## 2016-12-19 DIAGNOSIS — O24419 Gestational diabetes mellitus in pregnancy, unspecified control: Secondary | ICD-10-CM

## 2016-12-19 DIAGNOSIS — Z789 Other specified health status: Secondary | ICD-10-CM

## 2016-12-19 DIAGNOSIS — O0990 Supervision of high risk pregnancy, unspecified, unspecified trimester: Secondary | ICD-10-CM

## 2016-12-19 DIAGNOSIS — O09523 Supervision of elderly multigravida, third trimester: Secondary | ICD-10-CM

## 2016-12-19 DIAGNOSIS — Z23 Encounter for immunization: Secondary | ICD-10-CM

## 2016-12-19 DIAGNOSIS — O0993 Supervision of high risk pregnancy, unspecified, third trimester: Secondary | ICD-10-CM

## 2016-12-19 DIAGNOSIS — O360930 Maternal care for other rhesus isoimmunization, third trimester, not applicable or unspecified: Secondary | ICD-10-CM

## 2016-12-19 LAB — POCT URINALYSIS DIP (DEVICE)
Bilirubin Urine: NEGATIVE
GLUCOSE, UA: NEGATIVE mg/dL
Ketones, ur: NEGATIVE mg/dL
NITRITE: NEGATIVE
PROTEIN: 30 mg/dL — AB
SPECIFIC GRAVITY, URINE: 1.025 (ref 1.005–1.030)
UROBILINOGEN UA: 1 mg/dL (ref 0.0–1.0)
pH: 7 (ref 5.0–8.0)

## 2016-12-19 MED ORDER — RHO D IMMUNE GLOBULIN 1500 UNIT/2ML IJ SOSY
300.0000 ug | PREFILLED_SYRINGE | Freq: Once | INTRAMUSCULAR | Status: AC
  Administered 2016-12-19: 300 ug via INTRAMUSCULAR

## 2016-12-19 NOTE — Patient Instructions (Signed)
 Lactancia materna (Breastfeeding) Decidir amamantar es una de las mejores elecciones que puede hacer por usted y su beb. El cambio hormonal durante el embarazo produce el desarrollo del tejido mamario y aumenta la cantidad y el tamao de los conductos galactforos. Estas hormonas tambin permiten que las protenas, los azcares y las grasas de la sangre produzcan la leche materna en las glndulas productoras de leche. Las hormonas impiden que la leche materna sea liberada antes del nacimiento del beb, adems de impulsar el flujo de leche luego del nacimiento. Una vez que ha comenzado a amamantar, pensar en el beb, as como la succin o el llanto, pueden estimular la liberacin de leche de las glndulas productoras de leche. LOS BENEFICIOS DE AMAMANTAR Para el beb  La primera leche (calostro) ayuda a mejorar el funcionamiento del sistema digestivo del beb.  La leche tiene anticuerpos que ayudan a prevenir las infecciones en el beb.  El beb tiene una menor incidencia de asma, alergias y del sndrome de muerte sbita del lactante.  Los nutrientes en la leche materna son mejores para el beb que la leche maternizada y estn preparados exclusivamente para cubrir las necesidades del beb.  La leche materna mejora el desarrollo cerebral del beb.  Es menos probable que el beb desarrolle otras enfermedades, como obesidad infantil, asma o diabetes mellitus de tipo 2. Para usted  La lactancia materna favorece el desarrollo de un vnculo muy especial entre la madre y el beb.  Es conveniente. La leche materna siempre est disponible a la temperatura correcta y es econmica.  La lactancia materna ayuda a quemar caloras y a perder el peso ganado durante el embarazo.  Favorece la contraccin del tero al tamao que tena antes del embarazo de manera ms rpida y disminuye el sangrado (loquios) despus del parto.  La lactancia materna contribuye a reducir el riesgo de desarrollar diabetes  mellitus de tipo 2, osteoporosis o cncer de mama o de ovario en el futuro. SIGNOS DE QUE EL BEB EST HAMBRIENTO Primeros signos de hambre  Aumenta su estado de alerta o actividad.  Se estira.  Mueve la cabeza de un lado a otro.  Mueve la cabeza y abre la boca cuando se le toca la mejilla o la comisura de la boca (reflejo de bsqueda).  Aumenta las vocalizaciones, tales como sonidos de succin, se relame los labios, emite arrullos, suspiros, o chirridos.  Mueve la mano hacia la boca.  Se chupa con ganas los dedos o las manos. Signos tardos de hambre  Est agitado.  Llora de manera intermitente. Signos de hambre extrema Los signos de hambre extrema requerirn que lo calme y lo consuele antes de que el beb pueda alimentarse adecuadamente. No espere a que se manifiesten los siguientes signos de hambre extrema para comenzar a amamantar:  Agitacin.  Llanto intenso y fuerte.  Gritos. INFORMACIN BSICA SOBRE LA LACTANCIA MATERNA Iniciacin de la lactancia materna  Encuentre un lugar cmodo para sentarse o acostarse, con un buen respaldo para el cuello y la espalda.  Coloque una almohada o una manta enrollada debajo del beb para acomodarlo a la altura de la mama (si est sentada). Las almohadas para amamantar se han diseado especialmente a fin de servir de apoyo para los brazos y el beb mientras amamanta.  Asegrese de que el abdomen del beb est frente al suyo.  Masajee suavemente la mama. Con las yemas de los dedos, masajee la pared del pecho hacia el pezn en un movimiento circular. Esto estimula el   flujo de leche. Es posible que deba continuar este movimiento mientras amamanta si la leche fluye lentamente.  Sostenga la mama con el pulgar por arriba del pezn y los otros 4 dedos por debajo de la mama. Asegrese de que los dedos se encuentren lejos del pezn y de la boca del beb.  Empuje suavemente los labios del beb con el pezn o con el dedo.  Cuando la boca del  beb se abra lo suficiente, acrquelo rpidamente a la mama e introduzca todo el pezn y la zona oscura que lo rodea (areola), tanto como sea posible, dentro de la boca del beb. ? Debe haber ms areola visible por arriba del labio superior del beb que por debajo del labio inferior. ? La lengua del beb debe estar entre la enca inferior y la mama.  Asegrese de que la boca del beb est en la posicin correcta alrededor del pezn (prendida). Los labios del beb deben crear un sello sobre la mama y estar doblados hacia afuera (invertidos).  Es comn que el beb succione durante 2 a 3 minutos para que comience el flujo de leche materna. Cmo debe prenderse Es muy importante que le ensee al beb cmo prenderse adecuadamente a la mama. Si el beb no se prende adecuadamente, puede causarle dolor en el pezn y reducir la produccin de leche materna, y hacer que el beb tenga un escaso aumento de peso. Adems, si el beb no se prende adecuadamente al pezn, puede tragar aire durante la alimentacin. Esto puede causarle molestias al beb. Hacer eructar al beb al cambiar de mama puede ayudarlo a liberar el aire. Sin embargo, ensearle al beb cmo prenderse a la mama adecuadamente es la mejor manera de evitar que se sienta molesto por tragar aire mientras se alimenta. Signos de que el beb se ha prendido adecuadamente al pezn:  Tironea o succiona de modo silencioso, sin causarle dolor.  Se escucha que traga cada 3 o 4 succiones.  Hay movimientos musculares por arriba y por delante de sus odos al succionar. Signos de que el beb no se ha prendido adecuadamente al pezn:  Hace ruidos de succin o de chasquido mientras se alimenta.  Siente dolor en el pezn. Si cree que el beb no se prendi correctamente, deslice el dedo en la comisura de la boca y colquelo entre las encas del beb para interrumpir la succin. Intente comenzar a amamantar nuevamente. Signos de lactancia materna exitosa Signos del  beb:  Disminuye gradualmente el nmero de succiones o cesa la succin por completo.  Se duerme.  Relaja el cuerpo.  Retiene una pequea cantidad de leche en la boca.  Se desprende solo del pecho. Signos que presenta usted:  Las mamas han aumentado la firmeza, el peso y el tamao 1 a 3 horas despus de amamantar.  Estn ms blandas inmediatamente despus de amamantar.  Un aumento del volumen de leche, y tambin un cambio en su consistencia y color se producen hacia el quinto da de lactancia materna.  Los pezones no duelen, ni estn agrietados ni sangran. Signos de que su beb recibe la cantidad de leche suficiente  Mojar por lo menos 1 o 2 paales durante las primeras 24 horas despus del nacimiento.  Mojar por lo menos 5 o 6 paales cada 24 horas durante la primera semana despus del nacimiento. La orina debe ser transparente o de color amarillo plido a los 5 das despus del nacimiento.  Mojar entre 6 y 8 paales cada 24 horas a medida   que el beb sigue creciendo y desarrollndose.  Defeca al menos 3 veces en 24 horas a los 5 das de vida. La materia fecal debe ser blanda y amarillenta.  Defeca al menos 3 veces en 24 horas a los 7 das de vida. La materia fecal debe ser grumosa y amarillenta.  No registra una prdida de peso mayor del 10% del peso al nacer durante los primeros 3 das de vida.  Aumenta de peso un promedio de 4 a 7onzas (113 a 198g) por semana despus de los 4 das de vida.  Aumenta de peso, diariamente, de manera uniforme a partir de los 5 das de vida, sin registrar prdida de peso despus de las 2semanas de vida. Despus de alimentarse, es posible que el beb regurgite una pequea cantidad. Esto es frecuente. FRECUENCIA Y DURACIN DE LA LACTANCIA MATERNA El amamantamiento frecuente la ayudar a producir ms leche y a prevenir problemas de dolor en los pezones e hinchazn en las mamas. Alimente al beb cuando muestre signos de hambre o si siente la  necesidad de reducir la congestin de las mamas. Esto se denomina "lactancia a demanda". Evite el uso del chupete mientras trabaja para establecer la lactancia (las primeras 4 a 6 semanas despus del nacimiento del beb). Despus de este perodo, podr ofrecerle un chupete. Las investigaciones demostraron que el uso del chupete durante el primer ao de vida del beb disminuye el riesgo de desarrollar el sndrome de muerte sbita del lactante (SMSL). Permita que el nio se alimente en cada mama todo lo que desee. Contine amamantando al beb hasta que haya terminado de alimentarse. Cuando el beb se desprende o se queda dormido mientras se est alimentando de la primera mama, ofrzcale la segunda. Debido a que, con frecuencia, los recin nacidos permanecen somnolientos las primeras semanas de vida, es posible que deba despertar al beb para alimentarlo. Los horarios de lactancia varan de un beb a otro. Sin embargo, las siguientes reglas pueden servir como gua para ayudarla a garantizar que el beb se alimenta adecuadamente:  Se puede amamantar a los recin nacidos (bebs de 4 semanas o menos de vida) cada 1 a 3 horas.  No deben transcurrir ms de 3 horas durante el da o 5 horas durante la noche sin que se amamante a los recin nacidos.  Debe amamantar al beb 8 veces como mnimo en un perodo de 24 horas, hasta que comience a introducir slidos en su dieta, a los 6 meses de vida aproximadamente. EXTRACCIN DE LECHE MATERNA La extraccin y el almacenamiento de la leche materna le permiten asegurarse de que el beb se alimente exclusivamente de leche materna, aun en momentos en los que no puede amamantar. Esto tiene especial importancia si debe regresar al trabajo en el perodo en que an est amamantando o si no puede estar presente en los momentos en que el beb debe alimentarse. Su asesor en lactancia puede orientarla sobre cunto tiempo es seguro almacenar leche materna. El sacaleche es un aparato  que le permite extraer leche de la mama a un recipiente estril. Luego, la leche materna extrada puede almacenarse en un refrigerador o congelador. Algunos sacaleches son manuales, mientras que otros son elctricos. Consulte a su asesor en lactancia qu tipo ser ms conveniente para usted. Los sacaleches se pueden comprar; sin embargo, algunos hospitales y grupos de apoyo a la lactancia materna alquilan sacaleches mensualmente. Un asesor en lactancia puede ensearle cmo extraer leche materna manualmente, en caso de que prefiera no usar un sacaleche.   CMO CUIDAR LAS MAMAS DURANTE LA LACTANCIA MATERNA Los pezones se secan, agrietan y duelen durante la lactancia materna. Las siguientes recomendaciones pueden ayudarla a mantener las mamas humectadas y sanas:  Evite usar jabn en los pezones.  Use un sostn de soporte. Aunque no son esenciales, las camisetas sin mangas o los sostenes especiales para amamantar estn diseados para acceder fcilmente a las mamas, para amamantar sin tener que quitarse todo el sostn o la camiseta. Evite usar sostenes con aro o sostenes muy ajustados.  Seque al aire sus pezones durante 3 a 4minutos despus de amamantar al beb.  Utilice solo apsitos de algodn en el sostn para absorber las prdidas de leche. La prdida de un poco de leche materna entre las tomas es normal.  Utilice lanolina sobre los pezones luego de amamantar. La lanolina ayuda a mantener la humedad normal de la piel. Si usa lanolina pura, no tiene que lavarse los pezones antes de volver a alimentar al beb. La lanolina pura no es txica para el beb. Adems, puede extraer manualmente algunas gotas de leche materna y masajear suavemente esa leche sobre los pezones, para que la leche se seque al aire. Durante las primeras semanas despus de dar a luz, algunas mujeres pueden experimentar hinchazn en las mamas (congestin mamaria). La congestin puede hacer que sienta las mamas pesadas, calientes y  sensibles al tacto. El pico de la congestin ocurre dentro de los 3 a 5 das despus del parto. Las siguientes recomendaciones pueden ayudarla a aliviar la congestin:  Vace por completo las mamas al amamantar o extraer leche. Puede aplicar calor hmedo en las mamas (en la ducha o con toallas hmedas para manos) antes de amamantar o extraer leche. Esto aumenta la circulacin y ayuda a que la leche fluya. Si el beb no vaca por completo las mamas cuando lo amamanta, extraiga la leche restante despus de que haya finalizado.  Use un sostn ajustado (para amamantar o comn) o una camiseta sin mangas durante 1 o 2 das para indicar al cuerpo que disminuya ligeramente la produccin de leche.  Aplique compresas de hielo sobre las mamas, a menos que le resulte demasiado incmodo.  Asegrese de que el beb est prendido y se encuentre en la posicin correcta mientras lo alimenta. Si la congestin persiste luego de 48 horas o despus de seguir estas recomendaciones, comunquese con su mdico o un asesor en lactancia. RECOMENDACIONES GENERALES PARA EL CUIDADO DE LA SALUD DURANTE LA LACTANCIA MATERNA  Consuma alimentos saludables. Alterne comidas y colaciones, y coma 3 de cada una por da. Dado que lo que come afecta la leche materna, es posible que algunas comidas hagan que su beb se vuelva ms irritable de lo habitual. Evite comer este tipo de alimentos si percibe que afectan de manera negativa al beb.  Beba leche, jugos de fruta y agua para satisfacer su sed (aproximadamente 10 vasos al da).  Descanse con frecuencia, reljese y tome sus vitaminas prenatales para evitar la fatiga, el estrs y la anemia.  Contine con los autocontroles de la mama.  Evite masticar y fumar tabaco. Las sustancias qumicas de los cigarrillos que pasan a la leche materna y la exposicin al humo ambiental del tabaco pueden daar al beb.  No consuma alcohol ni drogas, incluida la marihuana. Algunos medicamentos, que  pueden ser perjudiciales para el beb, pueden pasar a travs de la leche materna. Es importante que consulte a su mdico antes de tomar cualquier medicamento, incluidos todos los medicamentos recetados y de venta   libre, as como los suplementos vitamnicos y herbales. Puede quedar embarazada durante la lactancia. Si desea controlar la natalidad, consulte a su mdico cules son las opciones ms seguras para el beb. SOLICITE ATENCIN MDICA SI:  Usted siente que quiere dejar de amamantar o se siente frustrada con la lactancia.  Siente dolor en las mamas o en los pezones.  Sus pezones estn agrietados o sangran.  Sus pechos estn irritados, sensibles o calientes.  Tiene un rea hinchada en cualquiera de las mamas.  Siente escalofros o fiebre.  Tiene nuseas o vmitos.  Presenta una secrecin de otro lquido distinto de la leche materna de los pezones.  Sus mamas no se llenan antes de amamantar al beb para el quinto da despus del parto.  Se siente triste y deprimida.  El beb est demasiado somnoliento como para comer bien.  El beb tiene problemas para dormir.  Moja menos de 3 paales en 24 horas.  Defeca menos de 3 veces en 24 horas.  La piel del beb o la parte blanca de los ojos se vuelven amarillentas.  El beb no ha aumentado de peso a los 5 das de vida.  SOLICITE ATENCIN MDICA DE INMEDIATO SI:  El beb est muy cansado (letargo) y no se quiere despertar para comer.  Le sube la fiebre sin causa.  Esta informacin no tiene como fin reemplazar el consejo del mdico. Asegrese de hacerle al mdico cualquier pregunta que tenga. Document Released: 03/28/2005 Document Revised: 07/20/2015 Document Reviewed: 09/19/2012 Elsevier Interactive Patient Education  2017 [employer]  

## 2016-12-19 NOTE — Progress Notes (Signed)
C/o white liquid discharge like water

## 2016-12-19 NOTE — Progress Notes (Signed)
    PRENATAL VISIT NOTE  Subjective:  Victoria Anthony is a 41 y.o. [id number] at 7610w0d being seen today for ongoing prenatal care.  She is currently monitored for the following issues for this high-risk pregnancy and has Lack of education; Supervision of high risk pregnancy, antepartum; AMA (advanced maternal age) multigravida 35+; Gestational diabetes mellitus (GDM) affecting pregnancy; Previous cesarean delivery affecting pregnancy; Language barrier affecting health care; Unwanted fertility; and Rh alloimmunization, maternal, antepartum on her problem list.  Patient reports no complaints. Reports some base of skull pain, when worrying about issues.  Contractions: Not present. Vag. Bleeding: None.  Movement: Present. Denies leaking of fluid.   The following portions of the patient's history were reviewed and updated as appropriate: allergies, current medications, past family history, past medical history, past social history, past surgical history and problem list. Problem list updated.  Objective:   Vitals:   12/19/16 0944  BP: 103/72  Pulse: 86  Weight: 134 lb 12.8 oz (61.1 kg)    Fetal Status: Fetal Heart Rate (bpm): 140   Movement: Present     General:  Alert, oriented and cooperative. Patient is in no acute distress.  Skin: Skin is warm and dry. No rash noted.   Cardiovascular: Normal heart rate noted  Respiratory: Normal respiratory effort, no problems with respiration noted  Abdomen: Soft, gravid, appropriate for gestational age.  Pain/Pressure: Absent     Pelvic: Cervical exam deferred        Extremities: Normal range of motion.  Edema: None  Mental Status:  Normal mood and affect. Normal behavior. Normal judgment and thought content.   FBS 68-110 (1 > 95) 2 Hr pp 66-111 U/s for growth on 9/6 shows vtx AFI 12.26 EFW 3 lb 11 oz (64%) Assessment and Plan:  Pregnancy: [id number] at 7010w0d  1. Supervision of high risk pregnancy, antepartum - Flu Vaccine QUAD 36+ mos IM  (Fluarix, Quad PF) - rho (d) immune globulin (RHIG/RHOPHYLAC) injection 300 mcg; Inject 2 mLs (300 mcg total) into the muscle once.  2. Elderly multigravida in third trimester Begin testing 32 wks  3. Gestational diabetes mellitus (GDM) affecting pregnancy CBGs are well controlled on diet  4. Language barrier affecting health care Video interpreter used  Preterm labor symptoms and general obstetric precautions including but not limited to vaginal bleeding, contractions, leaking of fluid and fetal movement were reviewed in detail with the patient. Please refer to After Visit Summary for other counseling recommendations.  Return in about 1 week (around 12/26/2016) [facility].   Reva Boresanya S Render Marley, MD

## 2016-12-26 ENCOUNTER — Encounter: Payer: Self-pay | Admitting: Obstetrics & Gynecology

## 2016-12-26 ENCOUNTER — Other Ambulatory Visit: Payer: Self-pay

## 2017-01-03 ENCOUNTER — Ambulatory Visit (INDEPENDENT_AMBULATORY_CARE_PROVIDER_SITE_OTHER): Payer: Self-pay | Admitting: Family Medicine

## 2017-01-03 ENCOUNTER — Encounter: Payer: Self-pay | Admitting: Family Medicine

## 2017-01-03 ENCOUNTER — Encounter: Payer: Self-pay | Admitting: Medical

## 2017-01-03 VITALS — BP 115/76 | HR 91 | Wt 134.9 lb

## 2017-01-03 DIAGNOSIS — O360990 Maternal care for other rhesus isoimmunization, unspecified trimester, not applicable or unspecified: Secondary | ICD-10-CM

## 2017-01-03 DIAGNOSIS — O0990 Supervision of high risk pregnancy, unspecified, unspecified trimester: Secondary | ICD-10-CM

## 2017-01-03 DIAGNOSIS — Z3009 Encounter for other general counseling and advice on contraception: Secondary | ICD-10-CM

## 2017-01-03 DIAGNOSIS — O34219 Maternal care for unspecified type scar from previous cesarean delivery: Secondary | ICD-10-CM

## 2017-01-03 DIAGNOSIS — O09523 Supervision of elderly multigravida, third trimester: Secondary | ICD-10-CM

## 2017-01-03 DIAGNOSIS — O24319 Unspecified pre-existing diabetes mellitus in pregnancy, unspecified trimester: Secondary | ICD-10-CM

## 2017-01-03 DIAGNOSIS — Z789 Other specified health status: Secondary | ICD-10-CM

## 2017-01-03 DIAGNOSIS — Z758 Other problems related to medical facilities and other health care: Secondary | ICD-10-CM

## 2017-01-03 NOTE — Progress Notes (Signed)
   PRENATAL VISIT NOTE  Subjective:  Victoria Anthony is a 41 y.o. [id number] at [redacted]w[redacted]d being seen today for ongoing prenatal care.  She is currently monitored for the following issues for this high-risk pregnancy and has Lack of education; Supervision of high risk pregnancy, antepartum; AMA (advanced maternal age) multigravida 35+; Preexisting diabetes complicating pregnancy, antepartum; Previous cesarean delivery affecting pregnancy; Language barrier affecting health care; Unwanted fertility; and Rh alloimmunization, maternal, antepartum on her problem list.  Patient reports no complaints.  Contractions: Not present. Vag. Bleeding: None.  Movement: Present. Denies leaking of fluid.   Brought glucose log. All CBGs are in the 60s to 90s.  The following portions of the patient's history were reviewed and updated as appropriate: allergies, current medications, past family history, past medical history, past social history, past surgical history and problem list. Problem list updated.  Objective:   Vitals:   01/03/17 1000  BP: 115/76  Pulse: 91  Weight: 134 lb 14.4 oz (61.2 kg)    Fetal Status: Fetal Heart Rate (bpm): 160   Movement: Present     General:  Alert, oriented and cooperative. Patient is in no acute distress.  Skin: Skin is warm and dry. No rash noted.   Cardiovascular: Normal heart rate noted  Respiratory: Normal respiratory effort, no problems with respiration noted  Abdomen: Soft, gravid, appropriate for gestational age.  Pain/Pressure: Present     Pelvic: Cervical exam deferred        Extremities: Normal range of motion.  Edema: None  Mental Status:  Normal mood and affect. Normal behavior. Normal judgment and thought content.   Assessment and Plan:  Pregnancy: [id number] at [redacted]w[redacted]d  1. Supervision of high risk pregnancy, antepartum -Start antenatal testing for Class B DM  2. Preexisting diabetes complicating pregnancy, antepartum: dx early first trimester. Fetal echo not  done d/t patient's lack of insurance Reviewed glucose log, all CBGs at goal (60s-90s) - Has growth scan scheduled for 10/4 - Needs antenatal testing started. Unable to stay today, but will schedule for later this week  3. Elderly multigravida in third trimester - Antenatal testing starting now d/t DM  4. Previous cesarean delivery affecting pregnancy - Plan for repeat C/S. Not candidate for TOLAC (C/S x4)  5. Language barrier affecting health care Elease Hashimoto, spanish interpreter through [employer] used  6. Unwanted fertility Awaiting Medicaid. Reports that if she does not get approved in time, would like Nexplanon  7. Rhesus isoimmunization during pregnancy, antepartum, single or unspecified fetus  Preterm labor symptoms and general obstetric precautions including but not limited to vaginal bleeding, contractions, leaking of fluid and fetal movement were reviewed in detail with the patient. Please refer to After Visit Summary for other counseling recommendations.  Return in about 2 weeks (around 01/17/2017).   Frederik Pear, MD   Future Appointments Date Time Provider Department Center  01/06/2017 8:00 AM WOC-W[facility]A NST WOC-WOCA WOC  01/12/2017 11:00 AM WH-MFC [place] 3 WH-MFCUS MFC-US

## 2017-01-04 ENCOUNTER — Encounter: Payer: Self-pay | Admitting: Medical

## 2017-01-06 ENCOUNTER — Encounter: Payer: Self-pay | Admitting: Family Medicine

## 2017-01-06 ENCOUNTER — Ambulatory Visit: Payer: Self-pay

## 2017-01-06 ENCOUNTER — Ambulatory Visit (INDEPENDENT_AMBULATORY_CARE_PROVIDER_SITE_OTHER): Payer: Self-pay | Admitting: *Deleted

## 2017-01-06 VITALS — BP 119/77 | HR 89

## 2017-01-06 DIAGNOSIS — O09523 Supervision of elderly multigravida, third trimester: Secondary | ICD-10-CM

## 2017-01-06 DIAGNOSIS — O24319 Unspecified pre-existing diabetes mellitus in pregnancy, unspecified trimester: Secondary | ICD-10-CM

## 2017-01-06 MED ORDER — TERCONAZOLE 0.4 % VA CREA: TOPICAL_CREAM | VAGINAL | 0 refills | Status: DC

## 2017-01-06 NOTE — Progress Notes (Signed)
Pt informed that the ultrasound is considered a limited OB ultrasound and is not intended to be a complete ultrasound exam.  Patient also informed that the ultrasound is not being completed with the intent of assessing for fetal or placental anomalies or any pelvic abnormalities.  Explained that the purpose of today's ultrasound is to assess for presentation, BPP and amniotic fluid volume.  Patient acknowledges the purpose of the exam and the limitations of the study.    [id number] Victoria Anthony

## 2017-01-10 ENCOUNTER — Encounter (HOSPITAL_COMMUNITY): Payer: Self-pay

## 2017-01-12 ENCOUNTER — Ambulatory Visit (INDEPENDENT_AMBULATORY_CARE_PROVIDER_SITE_OTHER): Payer: Self-pay | Admitting: Obstetrics & Gynecology

## 2017-01-12 ENCOUNTER — Other Ambulatory Visit: Payer: Self-pay | Admitting: Obstetrics & Gynecology

## 2017-01-12 ENCOUNTER — Encounter (HOSPITAL_COMMUNITY): Payer: Self-pay

## 2017-01-12 ENCOUNTER — Encounter: Payer: Self-pay | Admitting: Obstetrics & Gynecology

## 2017-01-12 ENCOUNTER — Ambulatory Visit (HOSPITAL_COMMUNITY)
Admission: RE | Admit: 2017-01-12 | Discharge: 2017-01-12 | Disposition: A | Discharge status: Home / Self Care | Payer: Self-pay | Source: Ambulatory Visit | Attending: Medical | Admitting: Medical

## 2017-01-12 ENCOUNTER — Ambulatory Visit (INDEPENDENT_AMBULATORY_CARE_PROVIDER_SITE_OTHER): Payer: Self-pay | Admitting: *Deleted

## 2017-01-12 ENCOUNTER — Other Ambulatory Visit (HOSPITAL_COMMUNITY): Payer: Self-pay | Admitting: Obstetrics and Gynecology

## 2017-01-12 DIAGNOSIS — Z3A34 34 weeks gestation of pregnancy: Secondary | ICD-10-CM

## 2017-01-12 DIAGNOSIS — O09523 Supervision of elderly multigravida, third trimester: Secondary | ICD-10-CM

## 2017-01-12 DIAGNOSIS — O24319 Unspecified pre-existing diabetes mellitus in pregnancy, unspecified trimester: Secondary | ICD-10-CM

## 2017-01-12 DIAGNOSIS — O0990 Supervision of high risk pregnancy, unspecified, unspecified trimester: Secondary | ICD-10-CM

## 2017-01-12 DIAGNOSIS — O34211 Maternal care for low transverse scar from previous cesarean delivery: Secondary | ICD-10-CM | POA: Insufficient documentation

## 2017-01-12 DIAGNOSIS — O34219 Maternal care for unspecified type scar from previous cesarean delivery: Secondary | ICD-10-CM

## 2017-01-12 DIAGNOSIS — O24410 Gestational diabetes mellitus in pregnancy, diet controlled: Secondary | ICD-10-CM

## 2017-01-12 DIAGNOSIS — O24420 Gestational diabetes mellitus in childbirth, diet controlled: Secondary | ICD-10-CM

## 2017-01-12 DIAGNOSIS — O0993 Supervision of high risk pregnancy, unspecified, third trimester: Secondary | ICD-10-CM

## 2017-01-12 DIAGNOSIS — O24313 Unspecified pre-existing diabetes mellitus in pregnancy, third trimester: Secondary | ICD-10-CM

## 2017-01-12 LAB — POCT URINALYSIS DIP (DEVICE)
Bilirubin Urine: NEGATIVE
GLUCOSE, UA: NEGATIVE mg/dL
Ketones, ur: NEGATIVE mg/dL
NITRITE: NEGATIVE
PROTEIN: NEGATIVE mg/dL
SPECIFIC GRAVITY, URINE: 1.025 (ref 1.005–1.030)
UROBILINOGEN UA: 2 mg/dL — AB (ref 0.0–1.0)
pH: 6.5 (ref 5.0–8.0)

## 2017-01-12 NOTE — Progress Notes (Signed)
   PRENATAL VISIT NOTE  Subjective:  Victoria Anthony is a 41 y.o. [id number] at [redacted]w[redacted]d being seen today for ongoing prenatal care.  She is currently monitored for the following issues for this high-risk pregnancy and has Lack of education; Supervision of high risk pregnancy, antepartum; AMA (advanced maternal age) multigravida 35+; Preexisting diabetes complicating pregnancy, antepartum; Previous cesarean delivery affecting pregnancy; Language barrier affecting health care; Unwanted fertility; and Rh alloimmunization, maternal, antepartum on her problem list.  Patient reports no complaints.   .  .   . Denies leaking of fluid.   The following portions of the patient's history were reviewed and updated as appropriate: allergies, current medications, past family history, past medical history, past social history, past surgical history and problem list. Problem list updated.  Objective:  There were no vitals filed for this visit.  Fetal Status:           General:  Alert, oriented and cooperative. Patient is in no acute distress.  Skin: Skin is warm and dry. No rash noted.   Cardiovascular: Normal heart rate noted  Respiratory: Normal respiratory effort, no problems with respiration noted  Abdomen: Soft, gravid, appropriate for gestational age.        Pelvic: Cervical exam deferred        Extremities: Normal range of motion.     Mental Status:  Normal mood and affect. Normal behavior. Normal judgment and thought content.   Assessment and Plan:  Pregnancy: [id number] at [redacted]w[redacted]d  1. Supervision of high risk pregnancy, antepartum Dates revised to correct EDV in Epic based on LMP and 18 week [place]  2. Elderly multigravida in third trimester NST reactive  3. Preexisting diabetes complicating pregnancy, antepartum Good diet control  Preterm labor symptoms and general obstetric precautions including but not limited to vaginal bleeding, contractions, leaking of fluid and fetal movement were reviewed in  detail with the patient. Please refer to After Visit Summary for other counseling recommendations.  Return in about 7 days (around 01/19/2017) for Add HOB;  10/18  NST/BPP & HOB. RCS at 39 weeks on 10/22  Scheryl Darter, MD

## 2017-01-12 NOTE — Progress Notes (Signed)
Video interpreter Leonie Man [phone] used for encounter. Pt has not obtained Rx for Terazol from pharmacy. She is also not taking ASA 81 mg. She feels it causes her to have difficulty sleeping. I reviewed the importance of the medication and stated that it should not cause sleep problems. If desired she can take it during the early part of the Share[place] Yim. She voiced understanding. [place] for growth and BPP done today.

## 2017-01-12 NOTE — Patient Instructions (Signed)

## 2017-01-17 ENCOUNTER — Telehealth (HOSPITAL_COMMUNITY): Payer: Self-pay | Admitting: *Deleted

## 2017-01-17 NOTE — Pre-Procedure Instructions (Signed)
Interpreter number [phone]

## 2017-01-17 NOTE — Telephone Encounter (Signed)
Preadmission screen  

## 2017-01-18 ENCOUNTER — Other Ambulatory Visit: Payer: Self-pay | Admitting: Obstetrics & Gynecology

## 2017-01-19 ENCOUNTER — Ambulatory Visit: Payer: Self-pay

## 2017-01-19 ENCOUNTER — Ambulatory Visit (INDEPENDENT_AMBULATORY_CARE_PROVIDER_SITE_OTHER): Payer: Self-pay | Admitting: *Deleted

## 2017-01-19 ENCOUNTER — Ambulatory Visit (INDEPENDENT_AMBULATORY_CARE_PROVIDER_SITE_OTHER): Payer: Self-pay | Admitting: Obstetrics & Gynecology

## 2017-01-19 ENCOUNTER — Encounter (HOSPITAL_COMMUNITY): Payer: Self-pay

## 2017-01-19 ENCOUNTER — Encounter: Payer: Self-pay | Admitting: *Deleted

## 2017-01-19 VITALS — BP 119/72 | HR 104

## 2017-01-19 VITALS — BP 119/72 | HR 104 | Wt 136.6 lb

## 2017-01-19 DIAGNOSIS — O24319 Unspecified pre-existing diabetes mellitus in pregnancy, unspecified trimester: Secondary | ICD-10-CM

## 2017-01-19 DIAGNOSIS — O0990 Supervision of high risk pregnancy, unspecified, unspecified trimester: Secondary | ICD-10-CM

## 2017-01-19 DIAGNOSIS — Z789 Other specified health status: Secondary | ICD-10-CM

## 2017-01-19 DIAGNOSIS — O09523 Supervision of elderly multigravida, third trimester: Secondary | ICD-10-CM

## 2017-01-19 DIAGNOSIS — Z113 Encounter for screening for infections with a predominantly sexual mode of transmission: Secondary | ICD-10-CM

## 2017-01-19 LAB — POCT URINALYSIS DIP (DEVICE)
BILIRUBIN URINE: NEGATIVE
GLUCOSE, UA: NEGATIVE mg/dL
KETONES UR: NEGATIVE mg/dL
Nitrite: NEGATIVE
PROTEIN: NEGATIVE mg/dL
Urobilinogen, UA: 1 mg/dL (ref 0.0–1.0)
pH: 6 (ref 5.0–8.0)

## 2017-01-19 NOTE — Pre-Procedure Instructions (Signed)
[id number] interpreter number

## 2017-01-19 NOTE — Progress Notes (Signed)
nst

## 2017-01-19 NOTE — Progress Notes (Signed)

## 2017-01-20 LAB — GC/CHLAMYDIA PROBE AMP (~~LOC~~) NOT AT ARMC
CHLAMYDIA, DNA PROBE: NEGATIVE
Neisseria Gonorrhea: NEGATIVE

## 2017-01-23 LAB — CULTURE, BETA STREP (GROUP B ONLY): Strep Gp B Culture: NEGATIVE

## 2017-01-26 ENCOUNTER — Ambulatory Visit (INDEPENDENT_AMBULATORY_CARE_PROVIDER_SITE_OTHER): Payer: Self-pay | Admitting: Obstetrics & Gynecology

## 2017-01-26 ENCOUNTER — Ambulatory Visit (INDEPENDENT_AMBULATORY_CARE_PROVIDER_SITE_OTHER): Payer: Self-pay | Admitting: *Deleted

## 2017-01-26 ENCOUNTER — Ambulatory Visit: Payer: Self-pay

## 2017-01-26 VITALS — BP 112/70 | HR 90 | Wt 136.5 lb

## 2017-01-26 DIAGNOSIS — O0993 Supervision of high risk pregnancy, unspecified, third trimester: Secondary | ICD-10-CM

## 2017-01-26 DIAGNOSIS — O24319 Unspecified pre-existing diabetes mellitus in pregnancy, unspecified trimester: Secondary | ICD-10-CM

## 2017-01-26 DIAGNOSIS — O09523 Supervision of elderly multigravida, third trimester: Secondary | ICD-10-CM

## 2017-01-26 DIAGNOSIS — O24313 Unspecified pre-existing diabetes mellitus in pregnancy, third trimester: Secondary | ICD-10-CM

## 2017-01-26 DIAGNOSIS — O0990 Supervision of high risk pregnancy, unspecified, unspecified trimester: Secondary | ICD-10-CM

## 2017-01-26 LAB — POCT URINALYSIS DIP (DEVICE)
Bilirubin Urine: NEGATIVE
GLUCOSE, UA: NEGATIVE mg/dL
Ketones, ur: NEGATIVE mg/dL
Nitrite: NEGATIVE
PH: 6.5 (ref 5.0–8.0)
PROTEIN: NEGATIVE mg/dL
UROBILINOGEN UA: 1 mg/dL (ref 0.0–1.0)

## 2017-01-26 NOTE — Progress Notes (Signed)
   PRENATAL VISIT NOTE  Subjective:  Victoria Anthony is a 41 y.o. [id number] at 3873w3d being seen today for ongoing prenatal care.  She is currently monitored for the following issues for this high-risk pregnancy and has Lack of education; Supervision of high risk pregnancy, antepartum; AMA (advanced maternal age) multigravida 35+; Preexisting diabetes complicating pregnancy, antepartum; Previous cesarean delivery affecting pregnancy; Language barrier affecting health care; Unwanted fertility; and Rh alloimmunization, maternal, antepartum on her problem list.  Patient reports no complaints.  Contractions: Not present. Vag. Bleeding: None.  Movement: Present. Denies leaking of fluid.   The following portions of the patient's history were reviewed and updated as appropriate: allergies, current medications, past family history, past medical history, past social history, past surgical history and problem list. Problem list updated.  Objective:   Vitals:   01/26/17 0914  BP: 112/70  Pulse: 90  Weight: 136 lb 8 oz (61.9 kg)    Fetal Status: Fetal Heart Rate (bpm): NST   Movement: Present     General:  Alert, oriented and cooperative. Patient is in no acute distress.  Skin: Skin is warm and dry. No rash noted.   Cardiovascular: Normal heart rate noted  Respiratory: Normal respiratory effort, no problems with respiration noted  Abdomen: Soft, gravid, appropriate for gestational age.  Pain/Pressure: Present     Pelvic: Cervical exam deferred        Extremities: Normal range of motion.  Edema: None  Mental Status:  Normal mood and affect. Normal behavior. Normal judgment and thought content.   Assessment and Plan:  Pregnancy: [id number] at 6873w3d  1. Supervision of high risk pregnancy, antepartum Delivery RCS in 4 days  2. Elderly multigravida in third trimester   3. Preexisting diabetes complicating pregnancy, antepartum States good diet control  Term labor symptoms and general obstetric  precautions including but not limited to vaginal bleeding, contractions, leaking of fluid and fetal movement were reviewed in detail with the patient. Please refer to After Visit Summary for other counseling recommendations.  Return if symptoms worsen or fail to improve, for postpartum.   Scheryl DarterJames Arnold, MD

## 2017-01-26 NOTE — Patient Instructions (Signed)
Parto por cesrea (Cesarean Delivery) El nacimiento o parto por cesrea es el parto quirrgico de un beb a travs de una incisin en el abdomen y el tero. Se lo puede llamar incisin cesrea. Este procedimiento se puede programar con anticipacin o se puede realizar en una situacin de emergencia. INFORME A SU MDICO:  Cualquier alergia que tenga.  Todos los medicamentos que utiliza, incluidos vitaminas, hierbas, gotas oftlmicas, cremas y medicamentos de venta libre.  Problemas previos que usted o los miembros de su familia hayan tenido con el uso de anestsicos.  Enfermedades de la sangre que tenga.  Si tiene cirugas previas.  Cualquier enfermedad que tenga.  Si usted o algn familiar tiene antecedentes de trombosis venosa profunda (TVP) o embolia pulmonar (EP).  RIESGOS Y COMPLICACIONES En general, se trata de un procedimiento seguro. Sin embargo, pueden ocurrir complicaciones, por ejemplo:  Infeccin.  Hemorragia.  Reacciones alrgicas a los medicamentos.  Daos a otras estructuras u otros rganos.  Cogulos sanguneos.  Lesiones al beb. ANTES DEL PROCEDIMIENTO  Siga las indicaciones del mdico respecto de las restricciones para las comidas o las bebidas.  Siga las indicaciones del mdico respecto de baarse antes del procedimiento para ayudar a reducir el riesgo de infeccin.  Si sabe que va a tener un parto por cesrea, no se rasure la zona pbica. Rasurarse antes del procedimiento puede aumentar el riesgo de infeccin.  Consulte al mdico si debe hacer o no lo siguiente: ? Cambiar o suspender los medicamentos que toma habitualmente. Esto es muy importante si toma medicamentos para la diabetes o anticoagulantes. ? El plan para controlar el dolor. Esto es muy importante si piensa amamantar a su beb. ? Cunto tiempo permanecer en el hospital despus del procedimiento. ? Cualquier inquietud que pueda tener sobre recibir hemoderivados si los necesita durante el  procedimiento. ? Almacenamiento de sangre del cordn, si planea guardar la sangre del cordn umbilical del beb.  Quiz tambin desee preguntarle al mdico lo siguiente: ? Si va a poder sostener o amamantar a su beb mientras an se encuentre en el quirfano. ? Si su beb puede quedarse con usted inmediatamente despus del procedimiento y durante su recuperacin. ? Si un familiar o la persona que usted elija puede ingresar al quirfano y permanecer con usted durante el procedimiento, inmediatamente despus de este y durante su recuperacin.  Haga arreglos para que alguien la lleve a su casa cuando le den el alta hospitalaria.  PROCEDIMIENTO  Se le colocarn monitores fetales en el abdomen para controlar la frecuencia cardaca de su beb y la suya.  Segn el motivo del parto por cesrea, es posible que se le realice un examen fsico o una prueba adicional, como una ecografa.  Le colocarn una va intravenosa (IV) en una de las venas.  Posiblemente le realicen un anlisis de sangre u orina.  Se le administrarn antibiticos para ayudar a prevenir las infecciones.  Se le puede entregar una bata especial de calentamiento para mantener estable la temperatura corporal.  Pueden rasurarle a zona pbica.  Le limpiarn la piel de la zona pbica y de la parte inferior del abdomen con una solucin para destruir las bacterias (antisptico).  Se le puede insertar un catter en la vejiga a travs de la uretra. Esto se hace para drenar la orina durante el procedimiento.  Pueden administrarle uno o ms de los siguientes medicamentos: ? Un medicamento para adormecer la zona (anestesia local). ? Un medicamento que lo har dormir (anestesia general). ? Un medicamento (  anestesia regional) que se le inyecta en la espalda o a travs de un tubo pequeo y delgado que se le coloca en la espalda (anestesia raqudea o anestesia epidural). Esto adormece la parte del cuerpo que est por debajo del lugar de la  inyeccin y le permite permanecer despierta durante el procedimiento. Si llega a sentir nuseas, dgaselo al mdico. Tendr medicamentos disponibles para ayudarla a reducir cualquier nusea que pueda sentir.  Le harn una incisin en el abdomen y luego en el tero.  Si est despierta durante el procedimiento, puede sentir tirones en el abdomen, pero no debera sentir dolor. Si siente dolor, dgaselo al mdico inmediatamente.  Se sacar al beb del tero. Es posible que sienta ms presin o un tirn, mientras esto sucede.  Inmediatamente despus del parto, se secar al beb y se lo mantendr caliente. Podr sostener y amamantar a su beb. Durante este momento, es posible que se pince y corte el cordn umbilical.  Se le extraer la placenta del tero.  Las incisiones se cerrarn con puntos (suturas). Es posible que se apliquen grapas, pegamento para la piel o tiras adhesivas en la incisin del abdomen.  Se colocarn vendas (vendajes) sobre la incisin del abdomen. Este procedimiento puede variar segn el mdico y el hospital. DESPUS DEL PROCEDIMIENTO  Le controlarn con frecuencia la presin arterial, la frecuencia cardaca, la frecuencia respiratoria y el nivel de oxgeno en la sangre hasta que haya desaparecido el efecto de los medicamentos administrados.  Puede seguir recibiendo lquidos o medicamentos por la va intravenosa.  Sentir un poco de dolor. Tendr analgsicos disponibles para ayudarla a controlar el dolor.  Para evitar la formacin de cogulos sanguneos: ? Se le pueden administrar medicamentos. ? Quizs deba usar medias o dispositivos de compresin. ? Se le indicar que camine cuando pueda hacerlo.  El personal del hospital alentar y apoyar que cree lazos con su beb. En el hospital, se le puede permitir que el beb permanezca en la misma habitacin que usted (internacin conjunta) durante la hospitalizacin para fomentar un amamantamiento exitoso.  Se le puede sugerir  que tosa y respire profundamente con frecuencia. Esto ayuda a evitar problemas pulmonares.  Si tiene un catter que le drena la orina, se le quitar lo antes posible despus del procedimiento.  Esta informacin no tiene como fin reemplazar el consejo del mdico. Asegrese de hacerle al mdico cualquier pregunta que tenga. Document Released: 03/28/2005 Document Revised: 07/20/2015 Document Reviewed: 01/06/2015 Elsevier Interactive Patient Education  2017 [employer]  

## 2017-01-26 NOTE — Progress Notes (Signed)

## 2017-01-26 NOTE — Progress Notes (Signed)
Rpt C/S scheduled on 10/22.

## 2017-01-27 ENCOUNTER — Encounter (HOSPITAL_COMMUNITY)
Admission: RE | Admit: 2017-01-27 | Discharge: 2017-01-27 | Disposition: A | Discharge status: Home / Self Care | Payer: Medicaid Other | Source: Ambulatory Visit | Attending: Obstetrics & Gynecology | Admitting: Obstetrics & Gynecology

## 2017-01-27 HISTORY — DX: Gestational diabetes mellitus in pregnancy, unspecified control: O24.419

## 2017-01-27 HISTORY — DX: Major depressive disorder, single episode, unspecified: F32.9

## 2017-01-27 HISTORY — DX: Depression, unspecified: F32.A

## 2017-01-27 LAB — CBC
HCT: 36.5 % (ref 36.0–46.0)
Hemoglobin: 12 g/dL (ref 12.0–15.0)
MCH: 28.4 pg (ref 26.0–34.0)
MCHC: 32.9 g/dL (ref 30.0–36.0)
MCV: 86.5 fL (ref 78.0–100.0)
PLATELETS: 182 10*3/uL (ref 150–400)
RBC: 4.22 MIL/uL (ref 3.87–5.11)
RDW: 13.8 % (ref 11.5–15.5)
WBC: 8 10*3/uL (ref 4.0–10.5)

## 2017-01-27 NOTE — Patient Instructions (Signed)
Instrucciones Para Antes de la Ciruga   Su ciruga est programada para 01/30/2017  (your procedure is scheduled on) Entre por la entrada principal del [facility]  a las 0900  de la [place] -(enter through the main entrance at [facility] at  AM    [employer] el telfono, [place] el [phone] para informarnos de su llegada. (pick up phone, dial [phone] on arrival)     Por favor llame al [phone] si tiene algn problema en la maana de la ciruga (please call  if you have any problems the morning of surgery.)                  Recuerde: (Remember)  No coma alimentos despues a la media. (Do not eat food )    No tome lquidos claros despues a la media. (Do not drink clear liquids )    No use joyas, maquillaje de ojos, lpiz labial, crema para el cuerpo o esmalte de uas oscuro. (Do not wear jewelry, eye makeup, lipstick, body lotion, or dark fingernail polish). Puede usar desodorante (you may wear deodorant)    No se afeite 48 horas de su ciruga. (Do not shave 48 hours before your surgery)    No traiga objetos de valor al hospital.  [facility] no se hace responsable de ninguna pertenencia, ni objetos de valor que haya trado al hospital. (Do not bring valuable to the hospital.  [facility] is not responsible for any belongings or valuables brought to the hospital)   [facility] estas medicinas en la maana de la ciruga con un SORBITO de agua nada (take these meds the morning of surgery with a SIP of water)     Durante la ciruga no se pueden usar lentes de contacto, dentaduras o puentes. (Contacts, dentures or bridgework cannot be worn in surgery).   Si va a ser ingresado despus de la ciruga, deje la [employer] Corporationmaleta en el carro hasta que se le haya asignado una habitacin. (If you are to be admitted after surgery, leave suitcase in car until your room has been assigned.)   A los pacientes que se les d de alta el mismo da no se les permitir manejar a casa.   (Patients discharged on the day of surgery will not be allowed to drive home)    [place] y nmero de telfono del Programmer, multimediaconductor na. (Name and telephone number of your driver)   Instrucciones especiales N/A (Special Instructions)   Por favor, lea las hojas informativas que le entregaron. (Please read over the following fact sheets that you were given) Surgical Site Infection Prevention

## 2017-01-28 LAB — RPR: RPR: NONREACTIVE

## 2017-01-30 ENCOUNTER — Encounter (HOSPITAL_COMMUNITY)
Admission: RE | Disposition: A | Discharge status: Home / Self Care | Payer: Self-pay | Source: Ambulatory Visit | Attending: Obstetrics & Gynecology

## 2017-01-30 ENCOUNTER — Inpatient Hospital Stay (HOSPITAL_COMMUNITY): Payer: Medicaid Other

## 2017-01-30 ENCOUNTER — Encounter (HOSPITAL_COMMUNITY): Payer: Self-pay | Admitting: *Deleted

## 2017-01-30 ENCOUNTER — Inpatient Hospital Stay (HOSPITAL_COMMUNITY)
Admission: RE | Admit: 2017-01-30 | Discharge: 2017-02-01 | DRG: 787 | Disposition: A | Discharge status: Home / Self Care | Payer: Medicaid Other | Source: Ambulatory Visit | Attending: Obstetrics & Gynecology | Admitting: Obstetrics & Gynecology

## 2017-01-30 DIAGNOSIS — Z3009 Encounter for other general counseling and advice on contraception: Secondary | ICD-10-CM | POA: Diagnosis present

## 2017-01-30 DIAGNOSIS — O24319 Unspecified pre-existing diabetes mellitus in pregnancy, unspecified trimester: Secondary | ICD-10-CM | POA: Diagnosis present

## 2017-01-30 DIAGNOSIS — Z98891 History of uterine scar from previous surgery: Secondary | ICD-10-CM

## 2017-01-30 DIAGNOSIS — O24420 Gestational diabetes mellitus in childbirth, diet controlled: Secondary | ICD-10-CM | POA: Diagnosis present

## 2017-01-30 DIAGNOSIS — O34219 Maternal care for unspecified type scar from previous cesarean delivery: Secondary | ICD-10-CM

## 2017-01-30 DIAGNOSIS — O360930 Maternal care for other rhesus isoimmunization, third trimester, not applicable or unspecified: Secondary | ICD-10-CM | POA: Diagnosis present

## 2017-01-30 DIAGNOSIS — Z789 Other specified health status: Secondary | ICD-10-CM | POA: Diagnosis present

## 2017-01-30 DIAGNOSIS — O34211 Maternal care for low transverse scar from previous cesarean delivery: Principal | ICD-10-CM | POA: Diagnosis present

## 2017-01-30 DIAGNOSIS — O0990 Supervision of high risk pregnancy, unspecified, unspecified trimester: Secondary | ICD-10-CM

## 2017-01-30 DIAGNOSIS — Z3A39 39 weeks gestation of pregnancy: Secondary | ICD-10-CM | POA: Diagnosis not present

## 2017-01-30 LAB — GLUCOSE, CAPILLARY
GLUCOSE-CAPILLARY: 77 mg/dL (ref 65–99)
Glucose-Capillary: 62 mg/dL — ABNORMAL LOW (ref 65–99)

## 2017-01-30 SURGERY — Surgical Case
Anesthesia: Spinal | Site: Abdomen | Wound class: Clean Contaminated

## 2017-01-30 MED ORDER — LACTATED RINGERS IV SOLN
INTRAVENOUS | Status: DC
  Administered 2017-01-31 (×2): via INTRAVENOUS

## 2017-01-30 MED ORDER — PRENATAL MULTIVITAMIN CH
1.0000 | ORAL_TABLET | Freq: Every day | ORAL | Status: DC
  Administered 2017-01-31: 1 via ORAL
  Filled 2017-01-30: qty 1

## 2017-01-30 MED ORDER — SIMETHICONE 80 MG PO CHEW
80.0000 mg | CHEWABLE_TABLET | ORAL | Status: DC
  Administered 2017-01-31 (×2): 80 mg via ORAL
  Filled 2017-01-30 (×2): qty 1

## 2017-01-30 MED ORDER — DIPHENHYDRAMINE HCL 50 MG/ML IJ SOLN: 12.5000 mg | INTRAMUSCULAR | Status: DC | PRN

## 2017-01-30 MED ORDER — OXYTOCIN 40 UNITS IN LACTATED RINGERS INFUSION - SIMPLE MED: 2.5000 [IU]/h | INTRAVENOUS | Status: AC

## 2017-01-30 MED ORDER — KETOROLAC TROMETHAMINE 30 MG/ML IJ SOLN
30.0000 mg | Freq: Four times a day (QID) | INTRAMUSCULAR | Status: AC
  Administered 2017-01-31 (×2): 30 mg via INTRAVENOUS
  Filled 2017-01-30 (×4): qty 1

## 2017-01-30 MED ORDER — PROMETHAZINE HCL 25 MG/ML IJ SOLN: 6.2500 mg | INTRAMUSCULAR | Status: DC | PRN

## 2017-01-30 MED ORDER — OXYCODONE HCL 5 MG PO TABS: 5.0000 mg | ORAL_TABLET | ORAL | Status: DC | PRN

## 2017-01-30 MED ORDER — BUPIVACAINE IN DEXTROSE 0.75-8.25 % IT SOLN
INTRATHECAL | Status: AC
  Filled 2017-01-30: qty 2

## 2017-01-30 MED ORDER — ZOLPIDEM TARTRATE 5 MG PO TABS: 5.0000 mg | ORAL_TABLET | Freq: Every evening | ORAL | Status: DC | PRN

## 2017-01-30 MED ORDER — COCONUT OIL OIL: 1.0000 "application " | TOPICAL_OIL | Status: DC | PRN

## 2017-01-30 MED ORDER — SIMETHICONE 80 MG PO CHEW
80.0000 mg | CHEWABLE_TABLET | Freq: Three times a day (TID) | ORAL | Status: DC
  Administered 2017-01-30 – 2017-02-01 (×5): 80 mg via ORAL
  Filled 2017-01-30 (×5): qty 1

## 2017-01-30 MED ORDER — MENTHOL 3 MG MT LOZG: 1.0000 | LOZENGE | OROMUCOSAL | Status: DC | PRN

## 2017-01-30 MED ORDER — NALBUPHINE HCL 10 MG/ML IJ SOLN: 5.0000 mg | Freq: Once | INTRAMUSCULAR | Status: DC | PRN

## 2017-01-30 MED ORDER — KETOROLAC TROMETHAMINE 30 MG/ML IJ SOLN
30.0000 mg | Freq: Four times a day (QID) | INTRAMUSCULAR | Status: AC | PRN
  Administered 2017-01-30 – 2017-01-31 (×2): 30 mg via INTRAVENOUS

## 2017-01-30 MED ORDER — DIPHENHYDRAMINE HCL 25 MG PO CAPS: 25.0000 mg | ORAL_CAPSULE | ORAL | Status: DC | PRN

## 2017-01-30 MED ORDER — MORPHINE SULFATE (PF) 0.5 MG/ML IJ SOLN
INTRAMUSCULAR | Status: AC
  Filled 2017-01-30: qty 10

## 2017-01-30 MED ORDER — TETANUS-DIPHTH-ACELL PERTUSSIS 5-2.5-18.5 LF-MCG/0.5 IM SUSP: 0.5000 mL | Freq: Once | INTRAMUSCULAR | Status: DC

## 2017-01-30 MED ORDER — FENTANYL CITRATE (PF) 100 MCG/2ML IJ SOLN
INTRAMUSCULAR | Status: DC | PRN
  Administered 2017-01-30: 20 ug via INTRATHECAL

## 2017-01-30 MED ORDER — DIPHENHYDRAMINE HCL 25 MG PO CAPS: 25.0000 mg | ORAL_CAPSULE | Freq: Four times a day (QID) | ORAL | Status: DC | PRN

## 2017-01-30 MED ORDER — MEPERIDINE HCL 25 MG/ML IJ SOLN
INTRAMUSCULAR | Status: DC | PRN
  Administered 2017-01-30 (×2): 12.5 mg via INTRAVENOUS

## 2017-01-30 MED ORDER — CEFAZOLIN SODIUM-DEXTROSE 2-4 GM/100ML-% IV SOLN
2.0000 g | INTRAVENOUS | Status: AC
  Administered 2017-01-30: 2 g via INTRAVENOUS
  Filled 2017-01-30: qty 100

## 2017-01-30 MED ORDER — PHENYLEPHRINE 8 MG IN D5W 100 ML (0.08MG/ML) PREMIX OPTIME
INJECTION | INTRAVENOUS | Status: AC
  Filled 2017-01-30: qty 100

## 2017-01-30 MED ORDER — OXYTOCIN 10 UNIT/ML IJ SOLN
INTRAMUSCULAR | Status: AC
  Filled 2017-01-30: qty 4

## 2017-01-30 MED ORDER — OXYCODONE HCL 5 MG PO TABS: 10.0000 mg | ORAL_TABLET | ORAL | Status: DC | PRN

## 2017-01-30 MED ORDER — KETOROLAC TROMETHAMINE 30 MG/ML IJ SOLN: 30.0000 mg | Freq: Four times a day (QID) | INTRAMUSCULAR | Status: AC | PRN

## 2017-01-30 MED ORDER — HYDROMORPHONE HCL 1 MG/ML IJ SOLN: 0.2500 mg | INTRAMUSCULAR | Status: DC | PRN

## 2017-01-30 MED ORDER — NALOXONE HCL 0.4 MG/ML IJ SOLN: 0.4000 mg | INTRAMUSCULAR | Status: DC | PRN

## 2017-01-30 MED ORDER — NALBUPHINE HCL 10 MG/ML IJ SOLN: 5.0000 mg | INTRAMUSCULAR | Status: DC | PRN

## 2017-01-30 MED ORDER — ONDANSETRON HCL 4 MG/2ML IJ SOLN: 4.0000 mg | Freq: Three times a day (TID) | INTRAMUSCULAR | Status: DC | PRN

## 2017-01-30 MED ORDER — PNEUMOCOCCAL VAC POLYVALENT 25 MCG/0.5ML IJ INJ
0.5000 mL | INJECTION | INTRAMUSCULAR | Status: DC
  Filled 2017-01-30: qty 0.5

## 2017-01-30 MED ORDER — WITCH HAZEL-GLYCERIN EX PADS: 1.0000 "application " | MEDICATED_PAD | CUTANEOUS | Status: DC | PRN

## 2017-01-30 MED ORDER — MEPERIDINE HCL 25 MG/ML IJ SOLN: 6.2500 mg | INTRAMUSCULAR | Status: DC | PRN

## 2017-01-30 MED ORDER — IBUPROFEN 600 MG PO TABS
600.0000 mg | ORAL_TABLET | Freq: Four times a day (QID) | ORAL | Status: DC
  Administered 2017-01-31 – 2017-02-01 (×3): 600 mg via ORAL
  Filled 2017-01-30 (×3): qty 1

## 2017-01-30 MED ORDER — ONDANSETRON HCL 4 MG/2ML IJ SOLN
INTRAMUSCULAR | Status: AC
  Filled 2017-01-30: qty 2

## 2017-01-30 MED ORDER — SODIUM CHLORIDE 0.9% FLUSH: 3.0000 mL | INTRAVENOUS | Status: DC | PRN

## 2017-01-30 MED ORDER — SCOPOLAMINE 1 MG/3DAYS TD PT72: 1.0000 | MEDICATED_PATCH | Freq: Once | TRANSDERMAL | Status: DC

## 2017-01-30 MED ORDER — DEXAMETHASONE SODIUM PHOSPHATE 4 MG/ML IJ SOLN
INTRAMUSCULAR | Status: AC
  Filled 2017-01-30: qty 1

## 2017-01-30 MED ORDER — PHENYLEPHRINE 8 MG IN D5W 100 ML (0.08MG/ML) PREMIX OPTIME
INJECTION | INTRAVENOUS | Status: DC | PRN
  Administered 2017-01-30: 60 ug/min via INTRAVENOUS

## 2017-01-30 MED ORDER — DIBUCAINE 1 % RE OINT: 1.0000 "application " | TOPICAL_OINTMENT | RECTAL | Status: DC | PRN

## 2017-01-30 MED ORDER — BUPIVACAINE HCL (PF) 0.5 % IJ SOLN
INTRAMUSCULAR | Status: AC
  Filled 2017-01-30: qty 30

## 2017-01-30 MED ORDER — MORPHINE SULFATE-NACL 0.5-0.9 MG/ML-% IV SOSY
PREFILLED_SYRINGE | INTRAVENOUS | Status: DC | PRN
  Administered 2017-01-30: .2 mg via INTRATHECAL

## 2017-01-30 MED ORDER — SODIUM CHLORIDE 0.9 % IR SOLN
Status: DC | PRN
  Administered 2017-01-30: 1

## 2017-01-30 MED ORDER — IBUPROFEN 600 MG PO TABS: 600.0000 mg | ORAL_TABLET | Freq: Four times a day (QID) | ORAL | Status: DC

## 2017-01-30 MED ORDER — LACTATED RINGERS IV SOLN
INTRAVENOUS | Status: DC
  Administered 2017-01-30: 12:00:00 via INTRAVENOUS
  Administered 2017-01-30: 125 mL via INTRAVENOUS

## 2017-01-30 MED ORDER — OXYTOCIN 10 UNIT/ML IJ SOLN
INTRAVENOUS | Status: DC | PRN
  Administered 2017-01-30: 40 [IU] via INTRAVENOUS

## 2017-01-30 MED ORDER — BUPIVACAINE HCL (PF) 0.5 % IJ SOLN
INTRAMUSCULAR | Status: DC | PRN
  Administered 2017-01-30: 30 mL

## 2017-01-30 MED ORDER — KETOROLAC TROMETHAMINE 30 MG/ML IJ SOLN: 30.0000 mg | Freq: Once | INTRAMUSCULAR | Status: DC | PRN

## 2017-01-30 MED ORDER — ONDANSETRON HCL 4 MG/2ML IJ SOLN
INTRAMUSCULAR | Status: DC | PRN
  Administered 2017-01-30: 4 mg via INTRAVENOUS

## 2017-01-30 MED ORDER — FENTANYL CITRATE (PF) 100 MCG/2ML IJ SOLN
INTRAMUSCULAR | Status: AC
  Filled 2017-01-30: qty 2

## 2017-01-30 MED ORDER — BUPIVACAINE IN DEXTROSE 0.75-8.25 % IT SOLN
INTRATHECAL | Status: DC | PRN
  Administered 2017-01-30: 10.5 mg via INTRATHECAL

## 2017-01-30 MED ORDER — NALOXONE HCL 2 MG/2ML IJ SOSY
1.0000 ug/kg/h | PREFILLED_SYRINGE | INTRAVENOUS | Status: DC | PRN
  Filled 2017-01-30: qty 2

## 2017-01-30 MED ORDER — ACETAMINOPHEN 325 MG PO TABS: 650.0000 mg | ORAL_TABLET | ORAL | Status: DC | PRN

## 2017-01-30 MED ORDER — MEPERIDINE HCL 25 MG/ML IJ SOLN
INTRAMUSCULAR | Status: AC
  Filled 2017-01-30: qty 1

## 2017-01-30 MED ORDER — SIMETHICONE 80 MG PO CHEW: 80.0000 mg | CHEWABLE_TABLET | ORAL | Status: DC | PRN

## 2017-01-30 MED ORDER — ACETAMINOPHEN 500 MG PO TABS
1000.0000 mg | ORAL_TABLET | Freq: Four times a day (QID) | ORAL | Status: AC
  Administered 2017-01-30 – 2017-01-31 (×3): 1000 mg via ORAL
  Filled 2017-01-30 (×3): qty 2

## 2017-01-30 MED ORDER — SENNOSIDES-DOCUSATE SODIUM 8.6-50 MG PO TABS
2.0000 | ORAL_TABLET | ORAL | Status: DC
  Administered 2017-01-31 (×2): 2 via ORAL
  Filled 2017-01-30 (×2): qty 2

## 2017-01-30 SURGICAL SUPPLY — 36 items
BARRIER ADHS 3X4 INTERCEED (GAUZE/BANDAGES/DRESSINGS) IMPLANT
BENZOIN TINCTURE PRP APPL 2/3 (GAUZE/BANDAGES/DRESSINGS) ×3 IMPLANT
CHLORAPREP W/TINT 26ML (MISCELLANEOUS) ×3 IMPLANT
CLAMP CORD UMBIL (MISCELLANEOUS) IMPLANT
CLOSURE STERI STRIP 1/2 X4 (GAUZE/BANDAGES/DRESSINGS) ×3 IMPLANT
CLOTH BEACON ORANGE TIMEOUT ST (SAFETY) ×3 IMPLANT
DECANTER SPIKE VIAL GLASS SM (MISCELLANEOUS) ×3 IMPLANT
DRSG OPSITE POSTOP 4X10 (GAUZE/BANDAGES/DRESSINGS) ×3 IMPLANT
ELECT REM PT RETURN 9FT ADLT (ELECTROSURGICAL) ×3
ELECTRODE REM PT RTRN 9FT ADLT (ELECTROSURGICAL) ×1 IMPLANT
EXTRACTOR VACUUM KIWI (MISCELLANEOUS) ×6 IMPLANT
GAUZE SPONGE 4X4 12PLY STRL (GAUZE/BANDAGES/DRESSINGS) ×3 IMPLANT
GLOVE BIO SURGEON STRL SZ 6.5 (GLOVE) ×2 IMPLANT
GLOVE BIO SURGEONS STRL SZ 6.5 (GLOVE) ×1
GLOVE BIOGEL PI IND STRL 7.0 (GLOVE) ×2 IMPLANT
GLOVE BIOGEL PI INDICATOR 7.0 (GLOVE) ×4
GOWN STRL REUS W/TWL LRG LVL3 (GOWN DISPOSABLE) ×6 IMPLANT
KIT ABG SYR 3ML LUER SLIP (SYRINGE) IMPLANT
NEEDLE HYPO 22GX1.5 SAFETY (NEEDLE) ×3 IMPLANT
NEEDLE HYPO 25X5/8 SAFETYGLIDE (NEEDLE) IMPLANT
NS IRRIG 1000ML POUR BTL (IV SOLUTION) ×3 IMPLANT
PACK C SECTION WH (CUSTOM PROCEDURE TRAY) ×3 IMPLANT
PAD ABD 7.5X8 STRL (GAUZE/BANDAGES/DRESSINGS) ×3 IMPLANT
PAD OB MATERNITY 4.3X12.25 (PERSONAL CARE ITEMS) ×3 IMPLANT
PENCIL SMOKE EVAC W/HOLSTER (ELECTROSURGICAL) ×3 IMPLANT
RETRACTOR WND ALEXIS 25 LRG (MISCELLANEOUS) IMPLANT
RTRCTR WOUND ALEXIS 25CM LRG (MISCELLANEOUS)
SUT PLAIN 2 0 (SUTURE) ×2
SUT PLAIN ABS 2-0 CT1 27XMFL (SUTURE) ×1 IMPLANT
SUT VIC AB 0 CT1 36 (SUTURE) ×18 IMPLANT
SUT VIC AB 2-0 CT1 27 (SUTURE) ×2
SUT VIC AB 2-0 CT1 TAPERPNT 27 (SUTURE) ×1 IMPLANT
SUT VIC AB 4-0 PS2 27 (SUTURE) ×3 IMPLANT
SYR CONTROL 10ML LL (SYRINGE) ×3 IMPLANT
TOWEL OR 17X24 6PK STRL BLUE (TOWEL DISPOSABLE) ×3 IMPLANT
TRAY FOLEY BAG SILVER LF 14FR (SET/KITS/TRAYS/PACK) IMPLANT

## 2017-01-30 NOTE — Op Note (Signed)
Victoria Anthony PROCEDURE DATE: 01/30/2017  PREOPERATIVE DIAGNOSES: Intrauterine pregnancy at [redacted]w[redacted]d weeks gestation; previous cesarean section x4  POSTOPERATIVE DIAGNOSES: The same  PROCEDURE: Repeat Low Transverse Cesarean Section  SURGEON:  Dr. Scheryl Darter  ASSISTANT:  Dr. Caryl Anthony  ANESTHESIOLOGY TEAM: Anesthesiologist: Victoria Able, MD CRNA: Victoria Greenhouse, CRNA; Victoria Kida, CRNA  INDICATIONS: Victoria Anthony is a 41 y.o. [phone] at [redacted]w[redacted]d here for cesarean section secondary to the indications listed under preoperative diagnoses; please see preoperative note for further details.  The risks of cesarean section were discussed with the patient including but were not limited to: bleeding which may require transfusion or reoperation; infection which may require antibiotics; injury to bowel, bladder, ureters or other surrounding organs; injury to the fetus; need for additional procedures including hysterectomy in the event of a life-threatening hemorrhage; placental abnormalities wth subsequent pregnancies, incisional problems, thromboembolic phenomenon and other postoperative/anesthesia complications.   The patient concurred with the proposed plan, giving informed written consent for the procedure.    FINDINGS:  Viable female infant in cephalic presentation.  Apgars 8 and 9.  Clear amniotic fluid.  Intact placenta, three vessel cord.  Normal uterus, fallopian tubes and ovaries bilaterally.  ANESTHESIA: Spinal INTRAVENOUS FLUIDS: 2400 ml   ESTIMATED BLOOD LOSS: 770 ml URINE OUTPUT:  100 ml SPECIMENS: Placenta sent to L&D COMPLICATIONS: None immediate  PROCEDURE IN DETAIL:  The patient preoperatively received intravenous antibiotics and had sequential compression devices applied to her lower extremities.  She was then taken to the operating room where spinal anesthesia was administered and was found to be adequate. She was then placed in a dorsal supine position with a  leftward tilt, and prepped and draped in a sterile manner.  A foley catheter was placed into her bladder and attached to constant gravity.  After an adequate timeout was performed, a Pfannenstiel skin incision was made with scalpel and carried through to the underlying layer of fascia. The fascia was incised in the midline, and this incision was extended bilaterally using the Mayo scissors.  Kocher clamps were applied to the superior aspect of the fascial incision and the underlying rectus muscles were dissected off sharply due to adhesions.  A similar process was carried out on the inferior aspect of the fascial incision. The rectus muscles were separated in the midline bluntly and the peritoneum was entered sharply with mets. Attention was turned to the lower uterine segment where a bladder flap was created. Then a low transverse hysterotomy was made with a scalpel and extended bilaterally bluntly.  The infant was successfully delivered with vaccum, the cord was clamped and cut after one minute, and the infant was handed over to the awaiting neonatology team. Uterine massage was then administered, and the placenta delivered intact with a three-vessel cord. The uterus was then cleared of clots and debris.  The hysterotomy was closed with 0 Vicryl in a running locked fashion, and an imbricating layer was also placed with 0 Vicryl. The pelvis was cleared of all clot and debris. Hemostasis was confirmed on all surfaces.  The peritoneum was closed with a 0 Vicryl running stitch. The fascia was then closed using 0 Vicryl in a running fashion.  The subcutaneous layer was irrigated, then reapproximated with 2-0 plain gut interrupted stitches, and 30 ml of 0.5% Marcaine was injected subcutaneously around the incision.  The skin was closed with a 4-0 Vicryl subcuticular stitch. The patient tolerated the procedure well. Sponge, lap, instrument and needle counts were correct x  3.  She was taken to the recovery room in stable  condition.   Victoria AdaJazma Kaysea Raya, DO OB Fellow

## 2017-01-30 NOTE — Anesthesia Postprocedure Evaluation (Signed)
Anesthesia Post Note  Patient: Victoria Anthony  Procedure(s) Performed: REPEAT CESAREAN SECTION (N/A Abdomen)     Patient location during evaluation: PACU Anesthesia Type: Spinal Level of consciousness: awake Pain management: pain level controlled Vital Signs Assessment: post-procedure vital signs reviewed and stable Respiratory status: spontaneous breathing Cardiovascular status: stable Postop Assessment: no headache, no backache, patient able to bend at knees, no apparent nausea or vomiting and spinal receding Anesthetic complications: no    Last Vitals:  Vitals:   01/30/17 1400 01/30/17 1405  BP: 109/79   Pulse: 85 82  Resp: 16 18  Temp: 36.8 C   SpO2: 100% 98%    Last Pain:  Vitals:   01/30/17 1400  TempSrc: Oral  PainSc: 0-No pain   Pain Goal:                 Burma Ketcher JR,JOHN Cayleb Jarnigan

## 2017-01-30 NOTE — Progress Notes (Signed)
Notified resident patient's honeycomb under the pressure dressing is saturated, no new orders, don't change at this time.

## 2017-01-30 NOTE — Anesthesia Preprocedure Evaluation (Signed)
Anesthesia Evaluation  Patient identified by MRN, date of birth, ID band Patient awake    Reviewed: Allergy & Precautions, H&P , NPO status , Patient's Chart, lab work & pertinent test results  Airway Mallampati: II  TM Distance: >3 FB Neck ROM: full    Dental no notable dental hx. (+) Teeth Intact   Pulmonary neg pulmonary ROS,    Pulmonary exam normal breath sounds clear to auscultation       Cardiovascular negative cardio ROS Normal cardiovascular exam Rhythm:regular Rate:Normal     Neuro/Psych negative neurological ROS     GI/Hepatic negative GI ROS, Neg liver ROS,   Endo/Other  diabetes  Renal/GU negative Renal ROS     Musculoskeletal   Abdominal Normal abdominal exam  (+)   Peds  Hematology negative hematology ROS (+)   Anesthesia Other Findings   Reproductive/Obstetrics (+) Pregnancy                             Anesthesia Physical Anesthesia Plan  ASA: II  Anesthesia Plan: Spinal   Post-op Pain Management:    Induction:   PONV Risk Score and Plan: 3 and Ondansetron, Dexamethasone, Midazolam and Scopolamine patch - Pre-op  Airway Management Planned: Natural Airway and Nasal Cannula  Additional Equipment:   Intra-op Plan:   Post-operative Plan:   Informed Consent: I have reviewed the patients History and Physical, chart, labs and discussed the procedure including the risks, benefits and alternatives for the proposed anesthesia with the patient or authorized representative who has indicated his/her understanding and acceptance.     Plan Discussed with: CRNA and Surgeon  Anesthesia Plan Comments:         Anesthesia Quick Evaluation

## 2017-01-30 NOTE — H&P (Signed)
Victoria Anthony is a 41 y.o. female presenting for repeat cesarean section.[id number] 7356w0d She has had 4 previous cesarean deliveries and requests repeat.Although she would like to have a tubal ligation she will opt to get an IUD due to increased cost. OB History    Gravida Para Term Preterm AB Living   5 4 3 1   4    SAB TAB Ectopic Multiple Live Births           4     Past Medical History:  Diagnosis Date  . Chest pain   . Depression    meds years ago  . Gestational diabetes   . High cholesterol   . Mental disorder   . No pertinent past medical history   . Vertigo    Past Surgical History:  Procedure Laterality Date  . CESAREAN SECTION     X3  . CESAREAN SECTION  03/23/2012   Procedure: CESAREAN SECTION;  Surgeon: Tilda BurrowJohn V Ferguson, MD;  Location: [facility];  Service: Obstetrics;  Laterality: N/A;  . CHOLECYSTECTOMY  ?  . NO PAST SURGERIES     Family History: family history includes Heart disease in her mother; Stomach cancer in her father and unknown relative. Social History:  reports that she has never smoked. She has never used smokeless tobacco. She reports that she does not drink alcohol or use drugs.     Maternal Diabetes: Yes:  Diabetes Type:  Pre-pregnancy Genetic Screening: Declined Maternal Ultrasounds/Referrals: Normal Fetal Ultrasounds or other Referrals: none Maternal Substance Abuse:  No Significant Maternal Medications:  Meds include: Other: no oral diabetic meds Significant Maternal Lab Results:  None Other Comments:  None  ROS Maternal Medical History:  Reason for admission: Elective surgery  Fetal activity: Perceived fetal activity is normal.    Prenatal Complications - Diabetes: gestational. Diabetes is managed by diet.        Blood pressure 133/83, pulse (!) 112, temperature 97.7 F (36.5 C), temperature source Oral, height 5\' 1"  (1.549 m), weight 61.7 kg (136 lb), last menstrual period 05/02/2016. Maternal Exam:  Abdomen: Patient reports no  abdominal tenderness. Surgical scars: low transverse and low vertical midline.   Introitus: not evaluated.   Cervix: not evaluated.   Physical Exam  Vitals reviewed. Constitutional: She is oriented to person, place, and time. She appears well-developed. No distress.  Cardiovascular: Normal rate.   Respiratory: Effort normal. No respiratory distress.  GI:  Gravid, surgical scars noted  Musculoskeletal: Normal range of motion.  Neurological: She is alert and oriented to person, place, and time.  Skin: Skin is warm and dry.  Psychiatric: She has a normal mood and affect. Her behavior is normal.    Prenatal labs: ABO, Rh: --/--/O NEG (10/19 1130) Antibody: POS (10/19 1130) Rubella: Immune, Immune (04/17 0000) RPR: Non Reactive (10/19 1130)  HBsAg: Negative (08/07 1645)  HIV:   negative GBS:   negative  Assessment/Plan: Scheduled for cesarean section today at 39 weeks. The risks of cesarean section discussed with the patient included but were not limited to: bleeding which may require transfusion or reoperation; infection which may require antibiotics; injury to bowel, bladder, ureters or other surrounding organs; injury to the fetus; need for additional procedures including hysterectomy in the event of a life-threatening hemorrhage; placental abnormalities wth subsequent pregnancies, incisional problems, thromboembolic phenomenon and other postoperative/anesthesia complications. The patient concurred with the proposed plan, giving informed written consent for the procedure.   Patient has been NPO since 2130 she will remain NPO for  procedure. Anesthesia and OR aware. Preoperative prophylactic antibiotics and SCDs ordered on call to the OR.  To OR when ready.       Scheryl Darter 01/30/2017, 11:09 AM

## 2017-01-30 NOTE — Anesthesia Procedure Notes (Signed)
Spinal  Start time: 01/30/2017 11:20 AM End time: 01/30/2017 11:23 AM Staffing Anesthesiologist: Leilani AbleHATCHETT, Iliani Vejar Performed: anesthesiologist  Preanesthetic Checklist Completed: patient identified, surgical consent, pre-op evaluation, timeout performed, IV checked, risks and benefits discussed and monitors and equipment checked Spinal Block Patient position: sitting Prep: site prepped and draped and DuraPrep Patient monitoring: heart rate, cardiac monitor, continuous pulse ox and blood pressure Approach: midline Location: L3-4 Injection technique: single-shot Needle Needle type: Pencan  Needle gauge: 24 G Needle length: 10 cm Needle insertion depth: 5 cm Assessment Sensory level: T4

## 2017-01-30 NOTE — Transfer of Care (Signed)
Immediate Anesthesia Transfer of Care Note  Patient: Victoria Anthony  Procedure(s) Performed: REPEAT CESAREAN SECTION (N/A Abdomen)  Patient Location: PACU  Anesthesia Type:Spinal  Level of Consciousness: sedated  Airway & Oxygen Therapy: Patient Spontanous Breathing  Post-op Assessment: Report given to RN  Post vital signs: Reviewed and stable  Last Vitals:  Vitals:   01/30/17 0820  BP: 133/83  Pulse: (!) 112  Temp: 36.5 C    Last Pain:  Vitals:   01/30/17 0820  TempSrc: Oral         Complications: No apparent anesthesia complications

## 2017-01-31 LAB — TYPE AND SCREEN
ABO/RH(D): O NEG
Antibody Screen: POSITIVE
DAT, IgG: NEGATIVE
UNIT DIVISION: 0
UNIT DIVISION: 0
Unit division: 0

## 2017-01-31 LAB — BPAM RBC
BLOOD PRODUCT EXPIRATION DATE: 201811022359
BLOOD PRODUCT EXPIRATION DATE: 201811052359
Blood Product Expiration Date: 201811012359
ISSUE DATE / TIME: 201810201128
ISSUE DATE / TIME: 201810201508
UNIT TYPE AND RH: 9500
UNIT TYPE AND RH: 9500
Unit Type and Rh: 9500

## 2017-01-31 LAB — CBC
HEMATOCRIT: 30 % — AB (ref 36.0–46.0)
HEMOGLOBIN: 10.1 g/dL — AB (ref 12.0–15.0)
MCH: 28.5 pg (ref 26.0–34.0)
MCHC: 33.7 g/dL (ref 30.0–36.0)
MCV: 84.7 fL (ref 78.0–100.0)
Platelets: 165 10*3/uL (ref 150–400)
RBC: 3.54 MIL/uL — AB (ref 3.87–5.11)
RDW: 13.7 % (ref 11.5–15.5)
WBC: 7.7 10*3/uL (ref 4.0–10.5)

## 2017-01-31 LAB — BIRTH TISSUE RECOVERY COLLECTION (PLACENTA DONATION)

## 2017-01-31 LAB — GLUCOSE, CAPILLARY: GLUCOSE-CAPILLARY: 87 mg/dL (ref 65–99)

## 2017-01-31 NOTE — Progress Notes (Signed)

## 2017-01-31 NOTE — Progress Notes (Signed)
Post Partum Day 1 Subjective: no complaints, up ad lib, voiding and tolerating PO  Objective: Blood pressure 105/61, pulse 80, temperature 98.7 F (37.1 C), temperature source Oral, resp. rate 18, height 5\' 1"  (1.549 m), weight 136 lb (61.7 kg), last menstrual period 05/02/2016, SpO2 100 %, unknown if currently breastfeeding.  Physical Exam:  General: alert, cooperative and no distress Lochia: appropriate Uterine Fundus: firm Incision: healing well, no significant drainage, no dehiscence, no significant erythema DVT Evaluation: No evidence of DVT seen on physical exam. No cords or calf tenderness.   Recent Labs  01/31/17 0548  HGB 10.1*  HCT 30.0*    Assessment/Plan: Plan for discharge tomorrow, Breastfeeding and Contraception Nexplanon   LOS: 1 day   Victoria Anthony 01/31/2017, 9:36 AM   CNM attestation Post Partum Day #1 I have seen and examined this patient and agree with above documentation in the resident's note.   Victoria Anthony is a 41 y.o. [id number] s/p rLTCS.  Pt denies problems with ambulating, voiding or po intake. Pain is well controlled.  Plan for birth control is Nexplanon.  Method of Feeding: breast  PE:  BP 120/63 (BP Location: Right Arm)   Pulse 92   Temp 98.2 F (36.8 C) (Oral)   Resp 20   Ht 5\' 1"  (1.549 m)   Wt 61.7 kg (136 lb)   LMP 05/02/2016 (Exact Date)   SpO2 99%   Breastfeeding? Unknown   BMI 25.70 kg/m  Fundus firm  Plan for discharge: 02/01/17  Cam HaiSHAW, KIMBERLY, CNM 2:06 PM 01/31/2017

## 2017-02-01 ENCOUNTER — Encounter (HOSPITAL_COMMUNITY): Payer: Self-pay | Admitting: *Deleted

## 2017-02-01 MED ORDER — OXYCODONE HCL 5 MG PO TABS: 5.0000 mg | ORAL_TABLET | Freq: Four times a day (QID) | ORAL | 0 refills | Status: AC | PRN

## 2017-02-01 MED ORDER — IBUPROFEN 600 MG PO TABS: 600.0000 mg | ORAL_TABLET | Freq: Four times a day (QID) | ORAL | 0 refills | Status: AC

## 2017-02-01 NOTE — Progress Notes (Signed)
Maternal and Infant discharge teaching is done with [facility] interpreting. Patient and spouse state understanding information.

## 2017-02-01 NOTE — Discharge Summary (Signed)
OB Discharge Summary     Patient Name: Victoria Anthony DOB: Nov 09, 1975 MRN: [id number]  Date of admission: 01/30/2017 Delivering MD: Adam PhenixARNOLD, JAMES G   Date of discharge: 02/01/2017  Admitting diagnosis: RCS Intrauterine pregnancy: 3658w0d     Secondary diagnosis:  Principal Problem:   Status post repeat low transverse cesarean section Active Problems:   Supervision of high risk pregnancy, antepartum   Preexisting diabetes complicating pregnancy, antepartum   Previous cesarean delivery affecting pregnancy   Language barrier affecting health care   Unwanted fertility  Additional problems: Advanced maternal age, lack of education, Rh alloimmunization maternal     Discharge diagnosis: Term Pregnancy Delivered                                                                                                Post partum procedures:none  Augmentation: Pitocin and Cytotec  Complications: None  Hospital course:  Onset of Labor With C-section    41 y.o. yo [id number] at 7058w0d was admitted in Latent Labor on 01/30/2017. Patient had an uncomplicated labor course as follows: C-section due to elective repeat. Membrane Rupture Time/Date: 11:51 AM ,01/30/2017   Intrapartum Procedures: Episiotomy: None [1]                                         Lacerations:  None [1]  Patient had a delivery of a Viable infant. 01/30/2017  Information for the patient's newborn:  Deneen Hartscosta-Mares, Girl Vernona RiegerLaura [[id number]][[id number]]  Delivery Method: C-Section, Vacuum Assisted (Filed from Delivery Summary)    Pateint had an uncomplicated postpartum course.  She is ambulating, tolerating a regular diet, passing flatus, and urinating well. Patient is discharged home in stable condition on 02/01/17.   Physical exam  Vitals:   01/31/17 0930 01/31/17 1200 01/31/17 1818 02/01/17 0600  BP: 120/63 108/64 (!) 98/56 104/68  Pulse: 92 82 84 84  Resp: 20 18 19 18   Temp: 98.2 F (36.8 C) (!) 97.2 F (36.2 C) 98.3 F (36.8 C) 98.4  F (36.9 C)  TempSrc: Oral Oral Axillary Oral  SpO2: 99% 98%    Weight:      Height:       General: alert, cooperative and no distress Lochia: appropriate Uterine Fundus: firm Incision: Healing well with no significant drainage, No significant erythema, Dressing is clean, dry, and intact DVT Evaluation: No evidence of DVT seen on physical exam. No cords or calf tenderness. Labs: Lab Results  Component Value Date   WBC 7.7 01/31/2017   HGB 10.1 (L) 01/31/2017   HCT 30.0 (L) 01/31/2017   MCV 84.7 01/31/2017   PLT 165 01/31/2017   CMP Latest Ref Rng & Units 05/14/2016  Glucose 65 - 99 mg/dL 875(I130(H)  BUN 6 - 20 mg/dL 11  Creatinine 4.330.44 - 2.951.00 mg/dL 1.880.51  Sodium 416135 - 606145 mmol/L 140  Potassium 3.5 - 5.1 mmol/L 3.2(L)  Chloride 101 - 111 mmol/L 107  CO2 22 - 32 mmol/L 23  Calcium 8.9 - 10.3 mg/dL 9.1  Total Protein 6.5 - 8.1 g/dL 7.6  Total Bilirubin 0.3 - 1.2 mg/dL 0.4  Alkaline Phos 38 - 126 U/L 70  AST 15 - 41 U/L 25  ALT 14 - 54 U/L 22    Discharge instruction: per After Visit Summary and "Baby and Me Booklet".  After visit meds:  Allergies as of 02/01/2017   No Known Allergies     Medication List    STOP taking these medications   aspirin EC 81 MG tablet   terconazole 0.4 % vaginal cream Commonly known as:  TERAZOL 7     TAKE these medications   ibuprofen 600 MG tablet Commonly known as:  ADVIL,MOTRIN Take 1 tablet (600 mg total) by mouth every 6 (six) hours.   oxyCODONE 5 MG immediate release tablet Commonly known as:  Oxy IR/ROXICODONE Take 1 tablet (5 mg total) by mouth every 6 (six) hours as needed for severe pain.   PRENATAL VITAMINS PO Take 1 tablet by mouth every evening.            Discharge Care Instructions        Start     Ordered   02/01/17 0000  Change dressing (specify)    Comments:  Remove dressing 7 days after your surgery. Removing while in the shower will be easier. If you there are any Steri-Strips under dressing, remove  those as well.   02/01/17 0953      Diet: routine diet  Activity: Advance as tolerated. Pelvic rest for 6 weeks.   Outpatient follow up: Follow up Appt:Future Appointments Date Time Provider Department Center  02/28/2017 10:20 AM Yarieliz Wasser, Kandra Nicolas, MD [facility]   Postpartum contraception: Nexplanon  Newborn Data: Live born female  Birth Weight: 6 lb 1.9 oz (2775 g) APGAR: 9, 9  Newborn Delivery   Birth date/time:  01/30/2017 11:54:00 Delivery type:  C-Section, Vacuum Assisted  C-section categorization:  Repeat     Baby Feeding: Breast Disposition:home with mother   02/01/2017 Arlyce Harman, DO  PGY-1, [facility]  OB FELLOW DISCHARGE ATTESTATION  I have seen and examined this patient and agree with above documentation in the resident's note.   Frederik Pear, MD OB Fellow 10:20 AM

## 2017-02-01 NOTE — Discharge Instructions (Signed)
Parto por cesárea, cuidados posteriores °(Cesarean Delivery, Care After) °Siga estas instrucciones durante las próximas semanas. Estas indicaciones le proporcionan información acerca de cómo deberá cuidarse después del procedimiento. El médico también podrá darle instrucciones más específicas. El tratamiento ha sido planificado según las prácticas médicas actuales, pero en algunos casos pueden ocurrir problemas. Comuníquese con el médico si tiene algún problema o dudas después del procedimiento. °QUÉ ESPERAR DESPUÉS DEL PROCEDIMIENTO °Después del procedimiento, es común tener los siguientes síntomas: °· Una pequeña cantidad de sangre o un líquido transparente que sale de la incisión. °· Tiene enrojecimiento, hinchazón o dolor en la zona de la incisión. °· Dolores y molestias abdominales. °· Hemorragia vaginal (loquios). °· Calambres pélvicos. °· Fatiga. °INSTRUCCIONES PARA EL CUIDADO EN EL HOGAR °Cuidado de la incisión °· Siga las indicaciones del médico acerca del cuidado de la incisión. Haga lo siguiente: °? Lávese las manos con agua y jabón antes de cambiar las vendas (vendaje). Use desinfectante para manos si no dispone de agua y jabón. °? Cambie el vendaje como se lo haya indicado el médico. °? No retire los puntos (suturas), las grapas cutáneas, el pegamento para la piel o las tiras adhesivas. Es posible que estos deban quedar puestos en la piel durante 2 semanas o más tiempo. Si los bordes de las tiras adhesivas empiezan a despegarse y enroscarse, puede recortar los que están sueltos. No retire las tiras adhesivas por completo a menos que el médico se lo indique. °· Controle todos los días la zona de la incisión para detectar signos de infección. Esté atenta a los siguientes signos: °? Aumento del enrojecimiento, la hinchazón o el dolor. °? Más líquido o sangre. °? Calor. °? Pus o mal olor. °· Cuando tosa o estornude, abrace una almohada. Esto ayuda con el dolor y disminuye la posibilidad de que su incisión  se abra (dehiscencia). Haga esto hasta que cicatrice completamente. °Medicamentos °· Tome los medicamentos de venta libre y los recetados solamente como se lo haya indicado el médico. °· Si le recetaron un antibiótico, tómelo como se lo haya indicado el médico. No interrumpa la administración del antibiótico hasta que no lo hay terminado. °Conducir °· No conduzca ni opere maquinaria pesada mientras toma analgésicos recetados. °· No conduzca durante 24 horas si le administraron un sedante. °Estilo de vida °· No beba alcohol. Esto es de suma importancia si está amamantando o toma analgésicos. °· No consuma productos que contengan tabaco, incluidos cigarrillos, tabaco de mascar o cigarrillos electrónicos. Si necesita ayuda para dejar de fumar, consulte al médico. El tabaco puede retrasar la cicatrización. °Comida y bebida °· Beba al menos 8 vasos de 8 onzas (240 cc) de agua todos los días a menos que el médico le indique lo contrario. Si amamanta, quizá deba beber aún más cantidad de agua. °· Ingiera alimentos ricos en fibras todos los días. Estos alimentos pueden ayudarla a prevenir o aliviar el estreñimiento. Los alimentos ricos en fibras incluyen, entre otros: °? Panes y cereales integrales. °? Arroz integral. °? Frijoles. °? Frutas y verduras frescas. °Actividad °· Reanude sus actividades normales como se lo haya indicado el médico. Pregúntele al médico qué actividades son seguras para usted. °· Descanse todo lo que pueda. Trate de descansar o tomar una siesta mientras el bebé duerme. °· No levante objetos que pesen más que su bebé o más de 10 libras (4,5 kg), como se lo haya indicado el médico. °· Pregúntele al médico cuándo puede volver a tener relaciones sexuales. Esto puede depender de lo siguiente: °? Riesgo de sufrir   infecciones. °? Velocidad de cicatrización. °? Comodidad y deseo de tener relaciones sexuales. °El baño °· No tome baños de inmersión, no nade ni use el jacuzzi hasta que el médico lo autorice.  Pregúntele al médico si puede ducharse. Tal vez solo le permitan darse baños de esponja hasta que la incisión se cure. °· Mantenga el vendaje seco, como se lo haya indicado el médico. °Instrucciones generales °· No use tampones ni se haga duchas vaginales hasta que el médico la autorice. °· Use lo siguiente: °? Ropa cómoda y suelta. °? Un sostén firme y bien ajustado. °· Controle la sangre que elimina por la vagina para detectar coágulos. Pueden tener el aspecto de grumos de color rojo oscuro, marrón o negro. °· Mantenga el perineo limpio y seco, como se lo haya indicado el médico. °· Cuando vaya al baño, siempre higienícese de adelante hacia atrás. °· Si es posible, consiga que alguien la ayude a cuidar de su bebé y con las tareas domésticas durante algunos días después de recibir el alta hospitalaria °· Concurra a todas las visitas de seguimiento para usted y el bebé, como se lo haya indicado el médico. Esto es importante. °SOLICITE ATENCIÓN MÉDICA SI: °· Tiene los siguientes síntomas: °? Una secreción vaginal con mal olor. °? Dificultad para orinar. °? Dolor al orinar. °? Aumento o disminución repentinos de la frecuencia con que defeca. °? Aumento del enrojecimiento, de la hinchazón o del dolor alrededor de la incisión. °? Aumento del líquido o de la sangre que sale de la incisión. °? Pus o mal olor en el lugar de la incisión. °? Fiebre. °? Una erupción cutánea. °? Poco interés o falta de interés en actividades que solían gustarle. °? Dudas sobre su cuidado y el del bebé. °? Náuseas. °· La incisión está caliente al tacto. °· Sus senos se ponen rojos o duros, o causan dolor. °· Siente tristeza o preocupación de forma inusual. °· Vomita. °· Elimina coágulos grandes por la vagina. Si elimina un coágulo de sangre, guárdelo para mostrárselo al médico. No elimine los coágulos sanguíneos por el inodoro sin mostrárselos a su médico. °· Orina más de lo habitual. °· Se siente mareada o se desmaya. °· No amamantó al bebé y  no tuvo su período menstrual en un período de 12 semanas posterior al parto. °· Usted dejó de amamantar al bebé y no tuvo su período menstrual en un período de 12 semanas posterior a haber dejado de amamantar. ° °SOLICITE ATENCIÓN MÉDICA DE INMEDIATO SI: °· Tiene los siguientes síntomas: °? Dolor que no desaparece o no mejora con el medicamento. °? Dolor en el pecho. °? Dificultad para respirar. °? Visión borrosa o manchas en la vista. °? Pensamientos de autolesionarse o lesionar al bebé. °? Nuevo dolor en el abdomen o en una de las piernas. °? Dolor de cabeza intenso. °· Se desmaya. °· Tiene una hemorragia tan intensa de la vagina que empapa dos toallitas sanitarias en una hora. ° °Esta información no tiene como fin reemplazar el consejo del médico. Asegúrese de hacerle al médico cualquier pregunta que tenga. °Document Released: 03/28/2005 Document Revised: 07/20/2015 Document Reviewed: 03/02/2015 °Elsevier Interactive Patient Education © 2017 [employer] ° °

## 2017-02-28 ENCOUNTER — Ambulatory Visit: Payer: Self-pay | Admitting: Family Medicine

## 2017-12-15 ENCOUNTER — Encounter: Payer: Self-pay | Admitting: *Deleted

## 2018-06-07 ENCOUNTER — Other Ambulatory Visit: Payer: Self-pay

## 2018-06-07 ENCOUNTER — Encounter (HOSPITAL_COMMUNITY): Payer: Self-pay

## 2018-06-07 ENCOUNTER — Emergency Department (HOSPITAL_COMMUNITY)
Admission: EM | Admit: 2018-06-07 | Discharge: 2018-06-08 | Disposition: A | Discharge status: Home / Self Care | Payer: Self-pay | Attending: Emergency Medicine | Admitting: Emergency Medicine

## 2018-06-07 DIAGNOSIS — Z9049 Acquired absence of other specified parts of digestive tract: Secondary | ICD-10-CM | POA: Insufficient documentation

## 2018-06-07 DIAGNOSIS — E876 Hypokalemia: Secondary | ICD-10-CM

## 2018-06-07 DIAGNOSIS — Z79899 Other long term (current) drug therapy: Secondary | ICD-10-CM | POA: Insufficient documentation

## 2018-06-07 DIAGNOSIS — F329 Major depressive disorder, single episode, unspecified: Secondary | ICD-10-CM | POA: Insufficient documentation

## 2018-06-07 DIAGNOSIS — R42 Dizziness and giddiness: Secondary | ICD-10-CM

## 2018-06-07 MED ORDER — SODIUM CHLORIDE 0.9% FLUSH: 3.0000 mL | Freq: Once | INTRAVENOUS | Status: DC

## 2018-06-07 NOTE — ED Triage Notes (Signed)
Patient here with family for being dizzy for years.  Over last week has become worse.  Seen at a clinic months ago. A&Ox4, ambulatory no neuro deficits.

## 2018-06-08 LAB — BASIC METABOLIC PANEL
Anion gap: 6 (ref 5–15)
BUN: 18 mg/dL (ref 6–20)
CHLORIDE: 109 mmol/L (ref 98–111)
CO2: 23 mmol/L (ref 22–32)
CREATININE: 0.51 mg/dL (ref 0.44–1.00)
Calcium: 9.4 mg/dL (ref 8.9–10.3)
GFR calc non Af Amer: >60 mL/min (ref >60)
Glucose, Bld: 103 mg/dL — ABNORMAL HIGH (ref 70–99)
Potassium: 3.1 mmol/L — ABNORMAL LOW (ref 3.5–5.1)
Sodium: 138 mmol/L (ref 135–145)

## 2018-06-08 LAB — I-STAT BETA HCG BLOOD, ED (MC, WL, AP ONLY): I-stat hCG, quantitative: <5 m[IU]/mL (ref <5)

## 2018-06-08 LAB — CBC
HEMATOCRIT: 42 % (ref 36.0–46.0)
HEMOGLOBIN: 13.4 g/dL (ref 12.0–15.0)
MCH: 26.9 pg (ref 26.0–34.0)
MCHC: 31.9 g/dL (ref 30.0–36.0)
MCV: 84.3 fL (ref 80.0–100.0)
NRBC: 0 % (ref 0.0–0.2)
Platelets: 261 10*3/uL (ref 150–400)
RBC: 4.98 MIL/uL (ref 3.87–5.11)
RDW: 13.2 % (ref 11.5–15.5)
WBC: 8.9 10*3/uL (ref 4.0–10.5)

## 2018-06-08 LAB — CBG MONITORING, ED: Glucose-Capillary: 95 mg/dL (ref 70–99)

## 2018-06-08 NOTE — ED Notes (Signed)
Patient verbalizes understanding of discharge instructions. Opportunity for questioning and answers were provided. Armband removed by staff, pt discharged from ED ambulatory.   

## 2018-06-08 NOTE — ED Provider Notes (Signed)
[facility] EMERGENCY DEPARTMENT Provider Note  CSN: [id number] Arrival date & time: 06/07/18 2333  Chief Complaint(s) Dizziness  HPI Victoria Anthony is a 43 y.o. female   The history is provided by the patient.  Dizziness  Quality:  Head spinning Severity:  Mild Onset quality:  Gradual Duration: several months. Timing:  Sporadic Progression:  Waxing and waning Chronicity:  Recurrent Relieved by:  Being still Worsened by:  Movement Associated symptoms: headaches (mild)   Associated symptoms: no chest pain, no diarrhea, no nausea, no palpitations, no shortness of breath, no tinnitus, no vision changes, no vomiting and no weakness    Patient reports that she notices her symptoms seem to be more frequent when she has lack of sleep.  States that they do improved after sleeping or taking Motrin.  Reports that she is currently asymptomatic   Past Medical History Past Medical History:  Diagnosis Date  . Chest pain   . Depression    meds years ago  . Gestational diabetes   . High cholesterol   . Mental disorder   . No pertinent past medical history   . Vertigo    Patient Active Problem List   Diagnosis Date Noted  . Status post repeat low transverse cesarean section 01/30/2017  . Unwanted fertility 10/24/2016  . Rh alloimmunization, maternal, antepartum 10/24/2016  . Previous cesarean delivery affecting pregnancy 08/08/2016  . Language barrier affecting health care 08/08/2016  . Lack of education 08/04/2016  . Supervision of high risk pregnancy, antepartum 08/04/2016  . AMA (advanced maternal age) multigravida 35+ 08/04/2016  . Preexisting diabetes complicating pregnancy, antepartum 08/04/2016   Home Medication(s) Prior to Admission medications   Medication Sig Start Date End Date Taking? Authorizing Provider  ibuprofen (ADVIL,MOTRIN) 600 MG tablet Take 1 tablet (600 mg total) by mouth every 6 (six) hours. 02/01/17   Degele, Kandra Nicolas, MD  oxyCODONE  (OXY IR/ROXICODONE) 5 MG immediate release tablet Take 1 tablet (5 mg total) by mouth every 6 (six) hours as needed for severe pain. 02/01/17   Degele, Kandra Nicolas, MD  Prenatal Multivit-Min-Fe-FA (PRENATAL VITAMINS PO) Take 1 tablet by mouth every evening.     [provider]                                                                                                                                    Past Surgical History Past Surgical History:  Procedure Laterality Date  . CESAREAN SECTION     X3  . CESAREAN SECTION  03/23/2012   Procedure: CESAREAN SECTION;  Surgeon: Tilda Burrow, MD;  Location: [facility];  Service: Obstetrics;  Laterality: N/A;  . CESAREAN SECTION N/A 01/30/2017   Procedure: REPEAT CESAREAN SECTION;  Surgeon: Adam Phenix, MD;  Location: [facility] BIRTHING SUITES;  Service: Obstetrics;  Laterality: N/A;  . CHOLECYSTECTOMY  ?  . NO PAST SURGERIES     Family History Family  History  Problem Relation Age of Onset  . Stomach cancer Other   . Heart disease Mother   . Stomach cancer Father     Social History Social History   Tobacco Use  . Smoking status: Never Smoker  . Smokeless tobacco: Never Used  Substance Use Topics  . Alcohol use: No  . Drug use: No   Allergies Patient has no known allergies.  Review of Systems Review of Systems  HENT: Negative for tinnitus.   Respiratory: Negative for shortness of breath.   Cardiovascular: Negative for chest pain and palpitations.  Gastrointestinal: Negative for diarrhea, nausea and vomiting.  Neurological: Positive for dizziness and headaches (mild). Negative for weakness.   All other systems are reviewed and are negative for acute change except as noted in the HPI  Physical Exam Vital Signs  I have reviewed the triage vital signs BP (!) 133/92   Pulse 98   Temp 98.3 F (36.8 C) (Oral)   Resp 18   SpO2 100%   Physical Exam Vitals signs reviewed.  Constitutional:      General: She is not in acute  distress.    Appearance: She is well-developed. She is not diaphoretic.  HENT:     Head: Normocephalic and atraumatic.     Nose: Nose normal.  Eyes:     General: No scleral icterus.       Right eye: No discharge.        Left eye: No discharge.     Conjunctiva/sclera: Conjunctivae normal.     Pupils: Pupils are equal, round, and reactive to light.  Neck:     Musculoskeletal: Normal range of motion and neck supple.  Cardiovascular:     Rate and Rhythm: Normal rate and regular rhythm.     Heart sounds: No murmur. No friction rub. No gallop.   Pulmonary:     Effort: Pulmonary effort is normal. No respiratory distress.     Breath sounds: Normal breath sounds. No stridor. No rales.  Abdominal:     General: There is no distension.     Palpations: Abdomen is soft.     Tenderness: There is no abdominal tenderness.  Musculoskeletal:        General: No tenderness.  Skin:    General: Skin is warm and dry.     Findings: No erythema or rash.  Neurological:     Mental Status: She is alert and oriented to person, place, and time.     Comments: Mental Status:  Alert and oriented to person, place, and time.  Attention and concentration normal.  Speech clear.  Recent memory is intact  Cranial Nerves:  II Visual Fields: Intact to confrontation. Visual fields intact. III, IV, VI: Pupils equal and reactive to light and near. Full eye movement without nystagmus  V Facial Sensation: Normal. No weakness of masticatory muscles  VII: No facial weakness or asymmetry  VIII Auditory Acuity: Grossly normal  IX/X: The uvula is midline; the palate elevates symmetrically  XI: Normal sternocleidomastoid and trapezius strength  XII: The tongue is midline. No atrophy or fasciculations.   Motor System: Muscle Strength: 5/5 and symmetric in the upper and lower extremities. No pronation or drift.  Muscle Tone: Tone and muscle bulk are normal in the upper and lower extremities.   Reflexes: DTRs: 1+ and  symmetrical in all four extremities. No Clonus Coordination: Intact finger-to-nose. No tremor.  Sensation: Intact to light touch. Gait: Routine gait normal.      ED Results  and Treatments Labs (all labs ordered are listed, but only abnormal results are displayed) Labs Reviewed  BASIC METABOLIC PANEL - Abnormal; Notable for the following components:      Result Value   Potassium 3.1 (*)    Glucose, Bld 103 (*)    All other components within normal limits  CBC  URINALYSIS, ROUTINE W REFLEX MICROSCOPIC  CBG MONITORING, ED  I-STAT BETA HCG BLOOD, ED ([facility], [facility], [facility] ONLY)                                                                                                                         EKG  EKG Interpretation  Date/Time:  Thursday June 07 2018 23:54:16 EST Ventricular Rate:  88 PR Interval:  120 QRS Duration: 66 QT Interval:  348 QTC Calculation: 421 R Axis:   67 Text Interpretation:  Normal sinus rhythm Cannot rule out Anterior infarct , age undetermined Abnormal ECG NO STEMI. No old tracing to compare Confirmed by Drema Pry [phone]) on 06/08/2018 2:52:16 AM      Radiology No results found. Pertinent labs & imaging results that were available during my care of the patient were reviewed by me and considered in my medical decision making (see chart for details).  Medications Ordered in ED Medications  sodium chloride flush (NS) 0.9 % injection 3 mL (has no administration in time range)                                                                                                                                    Procedures Procedures  (including critical care time)  Medical Decision Making / ED Course I have reviewed the nursing notes for this encounter and the patient's prior records (if available in EHR or on provided paperwork).    Intermittent mild headaches was associated dizziness.  Patient is asymptomatic at this time.  Exam nonfocal.  . No recent head  trauma. No fever. Doubt meningitis. Doubt intracranial bleed. Doubt IIH. No indication for imaging.   Labs and EKG reassuring.  Patient did have some mild hypokalemia but no need for emergent repletion at this time.  The patient appears reasonably screened and/or stabilized for discharge and I doubt any other medical condition or other [facility] requiring further screening, evaluation, or treatment in the ED at this time prior to discharge.  The patient is safe for discharge with strict return precautions.  Final Clinical Impression(s) / ED Diagnoses Final diagnoses:  Dizziness  Low blood potassium   Disposition: Discharge  Condition: Good  I have discussed the results, Dx and Tx plan with the patient who expressed understanding and agree(s) with the plan. Discharge instructions discussed at great length. The patient was given strict return precautions who verbalized understanding of the instructions. No further questions at time of discharge.    ED Discharge Orders    None       Follow Up: Quentin Mulling, MD [address] [place] [address] [phone]  Schedule an appointment as soon as possible for a visit  If symptoms do not improve or  worsen      This chart was dictated using voice recognition software.  Despite best efforts to proofread,  errors can occur which can change the documentation meaning.   Nira Conn, MD 06/08/18 [phone]

## 2019-05-22 IMAGING — US US FETAL BPP W/ NON-STRESS
1 series · 11 of 11 positions shown · non-contrast
Comparison: none

[Series 1: us fetal bpp w/nonstress · 11 acquisitions, 11 frames shown]
[im 1/11]
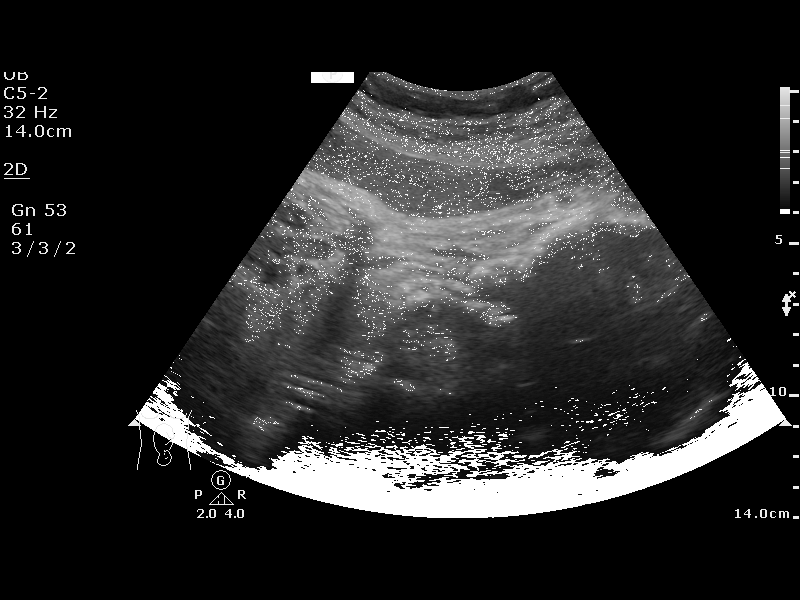
[im 2/11]
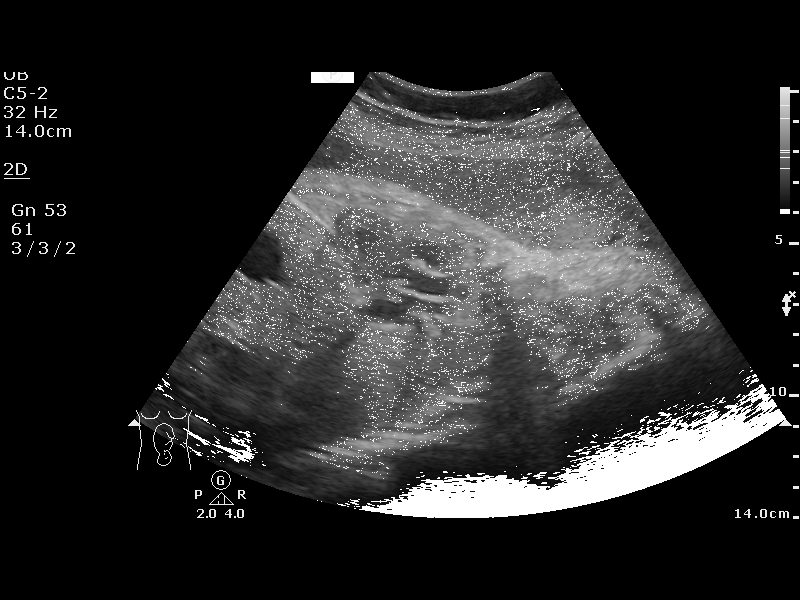
[im 3/11]
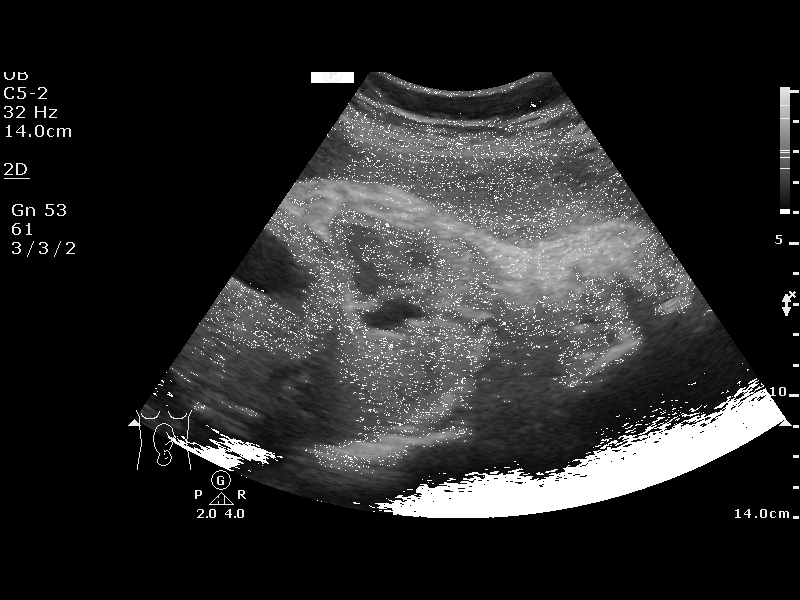
[im 4/11]
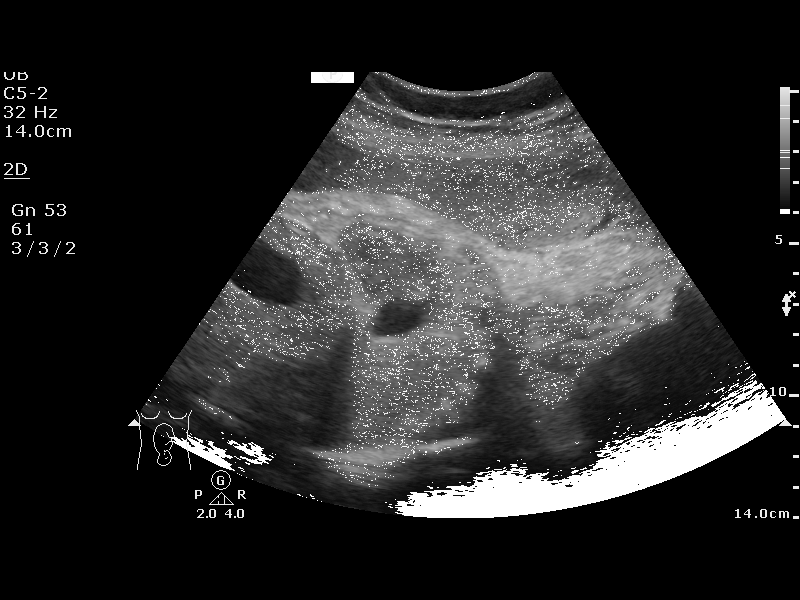
[im 5/11]
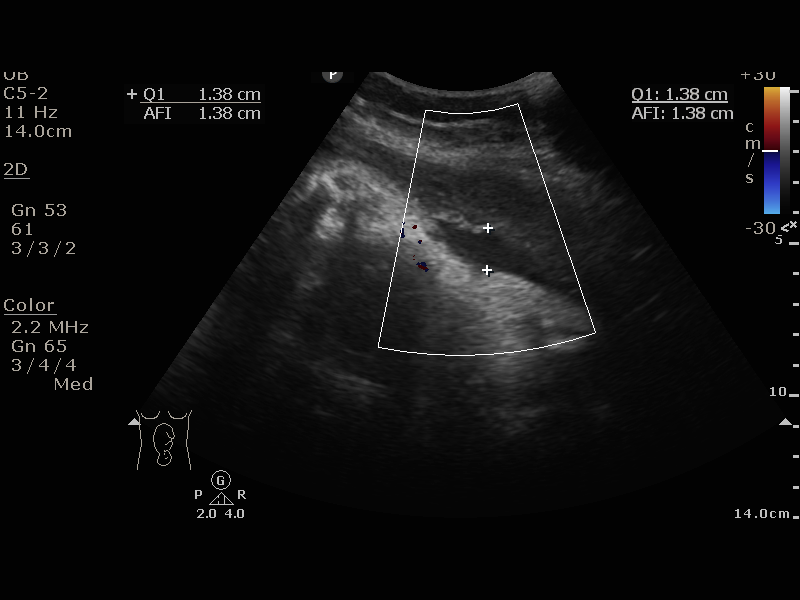
[im 6/11]
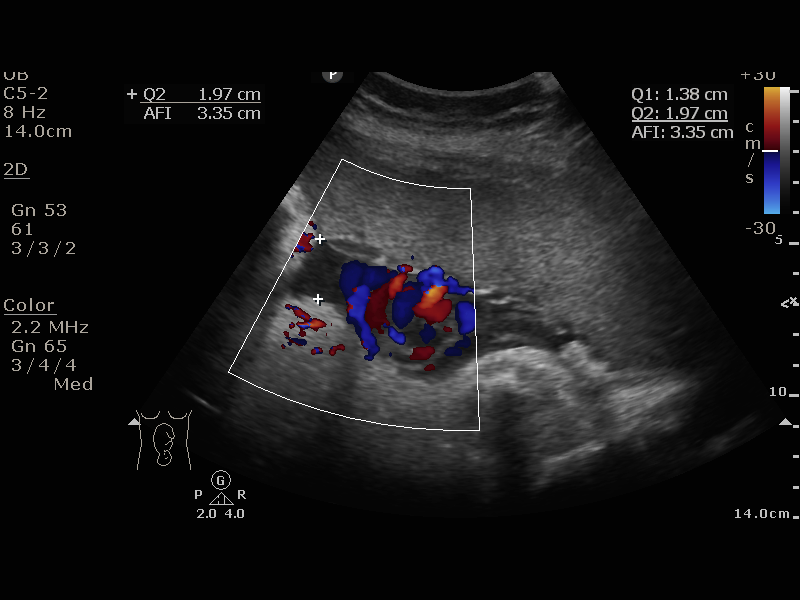
[im 7/11]
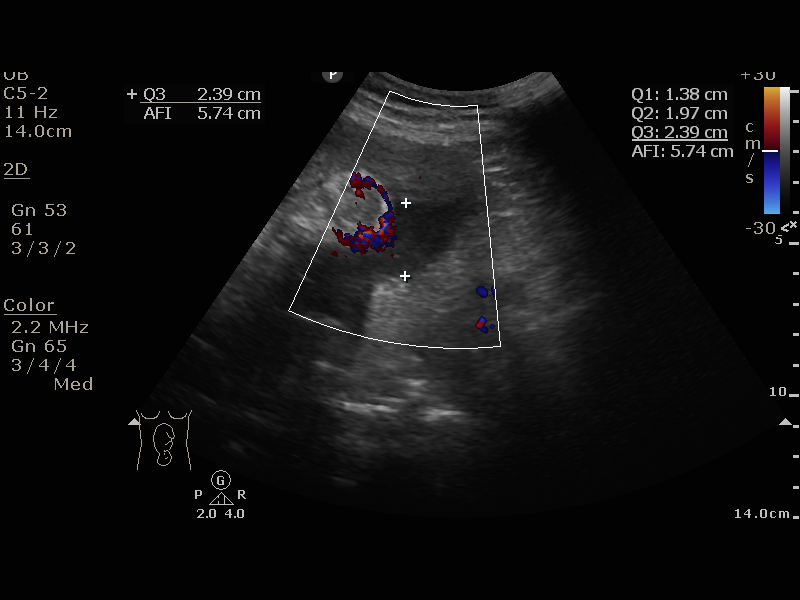
[im 8/11]
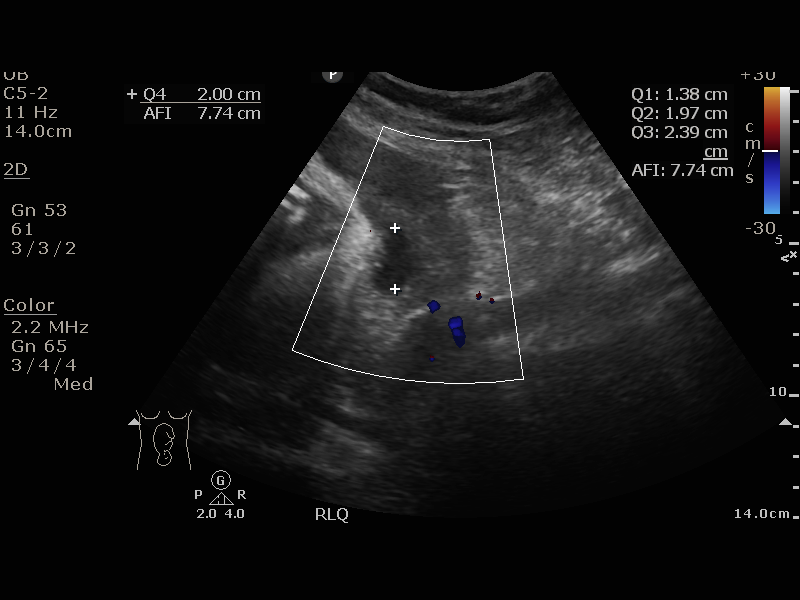
[im 9/11]
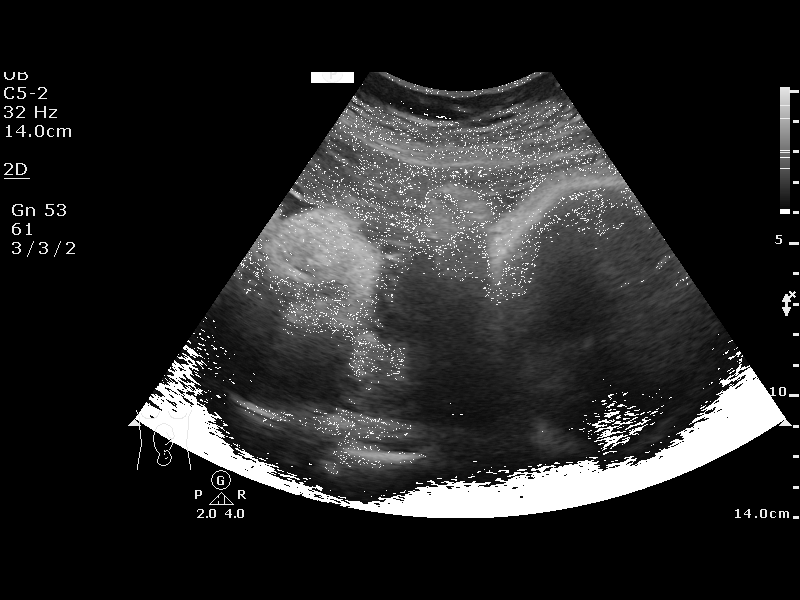
[im 10/11]
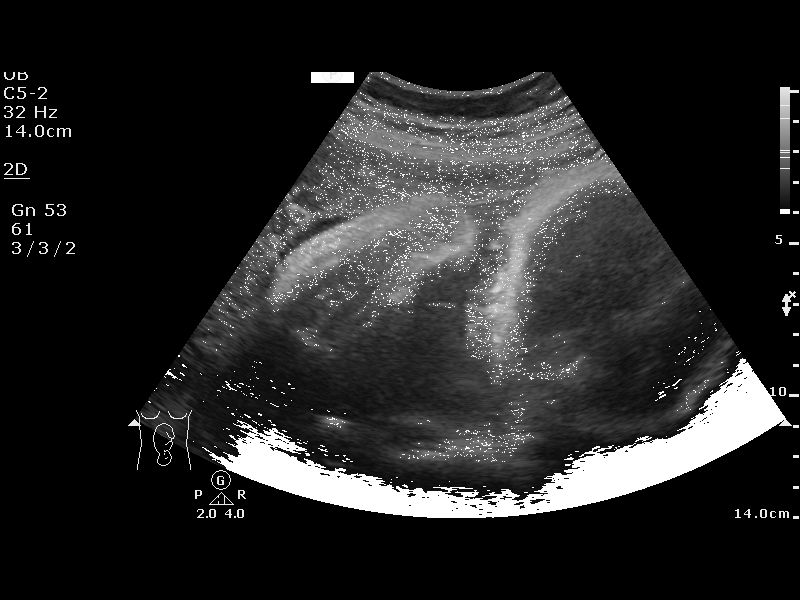
[im 11/11]
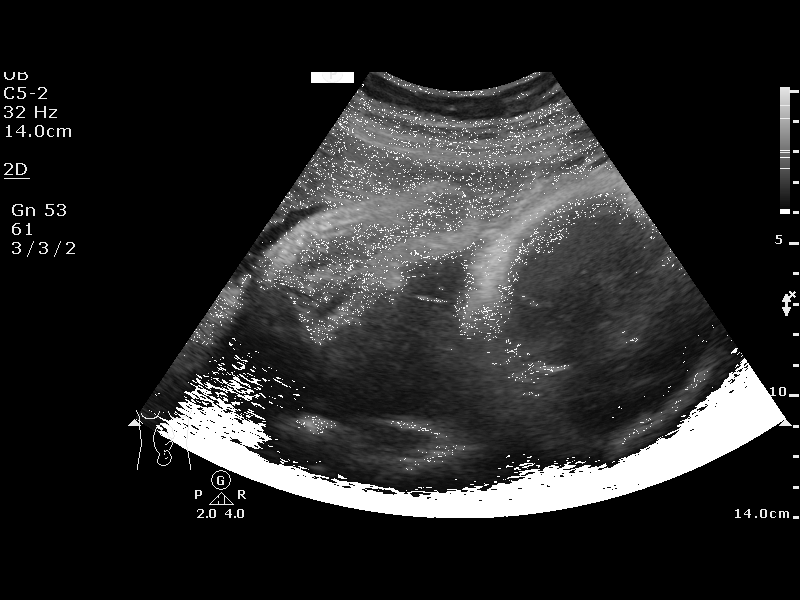

[11 of 11 positions shown; findings below may reference images not displayed]

Women's
[REDACTED]

1  US FETAL BPP W/NONSTRESS                    76818.4

1  BAYRON OZ             [id number]      [id number]     [id number]
Service(s) Provided

Indications

35 weeks gestation of pregnancy
Advanced maternal age multigravida 35+,
third trimester
Pre-existing diabetes, type 2, in pregnancy,
third trimester
OB History

Gravidity:    5         Term:   3        Prem:   1        SAB:   0
TOP:          0       Ectopic:  0        Living: 4
Fetal Evaluation

Num Of Fetuses:     1
Preg. Location:     Intrauterine
Cardiac Activity:   Observed
Presentation:       Cephalic

Amniotic Fluid
AFI FV:      Subjectively low-normal

AFI Sum(cm)     %Tile       Largest Pocket(cm)
7.74            5

RUQ(cm)       RLQ(cm)       LUQ(cm)        LLQ(cm)
1.38          2
Biophysical Evaluation

Amniotic F.V:   Pocket => 2 cm two         F. Tone:        Not Observed
planes
F. Movement:    Observed                   N.S.T:          Reactive
F. Breathing:   Observed                   Score:          [DATE]
Gestational Age

LMP:           37w 3d        Date:  05/02/16                 EDD:   02/06/17
Best:          35w 3d     Det. By:  U/S  (11/17/16)          EDD:   02/20/17

## 2019-05-29 IMAGING — US US FETAL BPP W/ NON-STRESS
1 series · 13 of 13 positions shown · non-contrast
Comparison: none

[Series 1: us fetal bpp w/nonstress · 13 acquisitions, 13 frames shown]
[im 1/13]
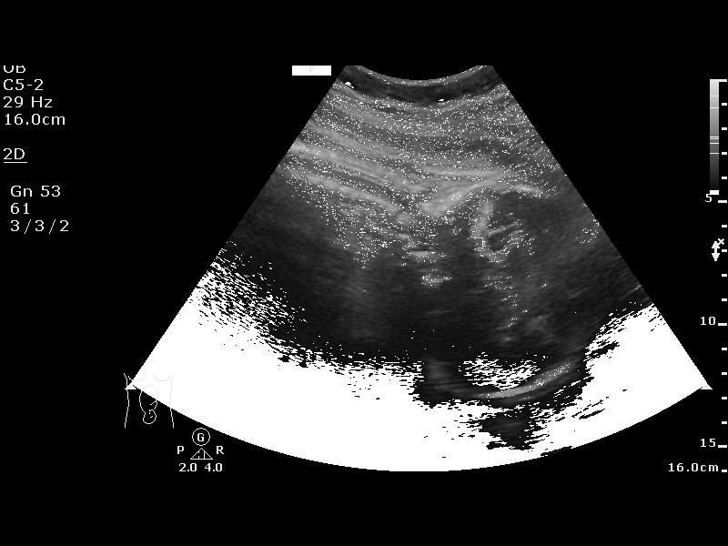
[im 2/13]
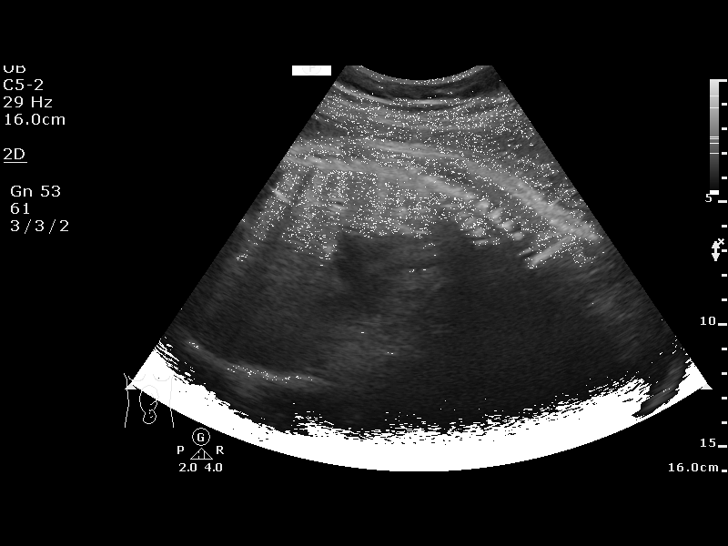
[im 3/13]
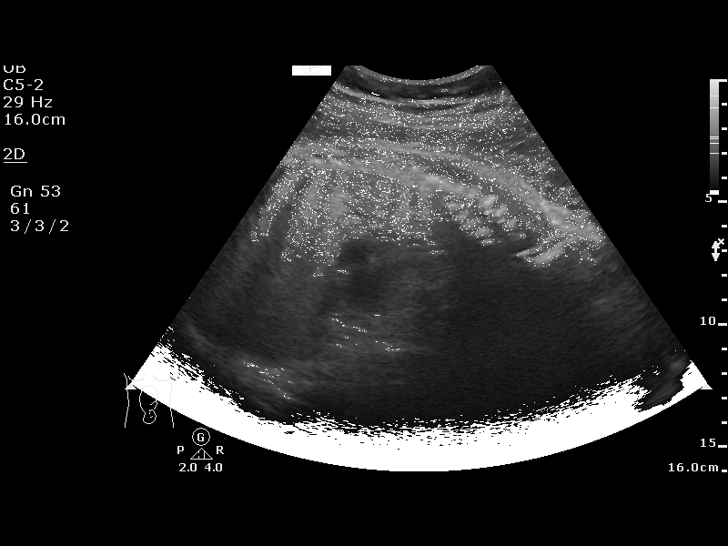
[im 4/13]
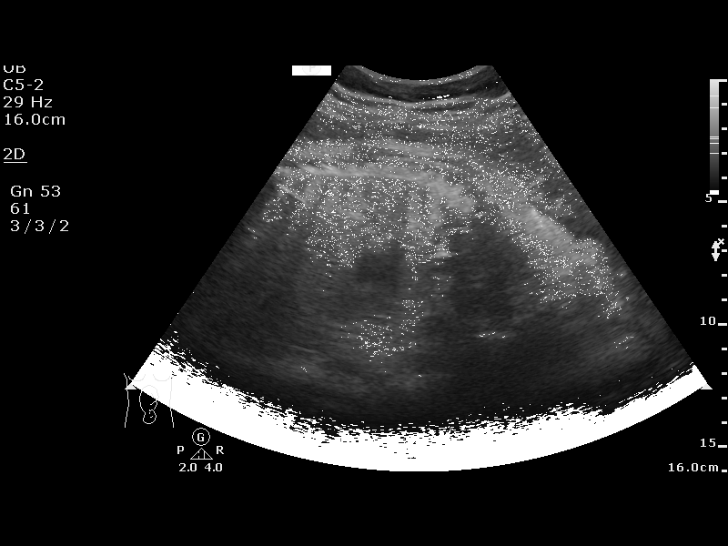
[im 5/13]
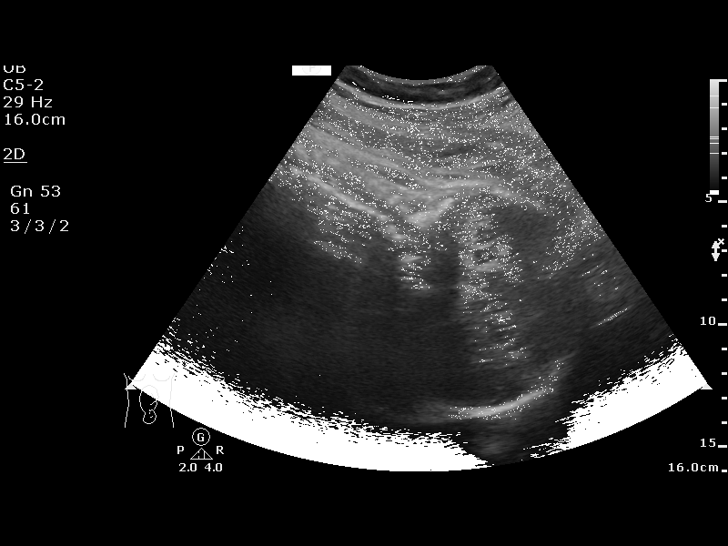
[im 6/13]
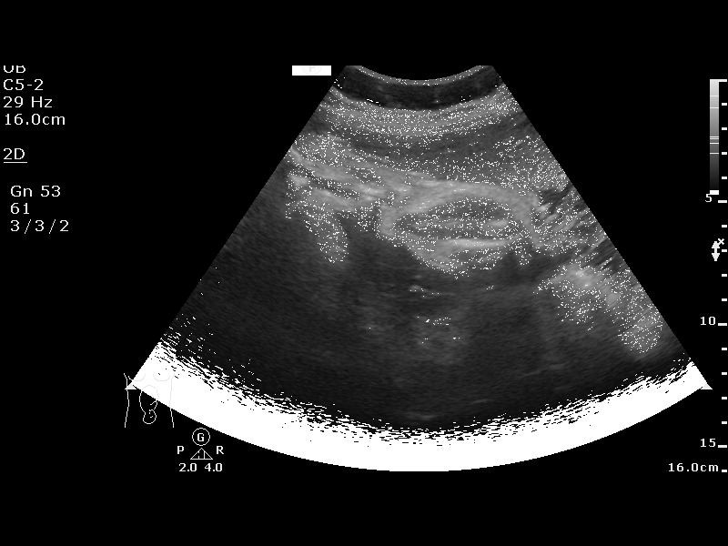
[im 7/13]
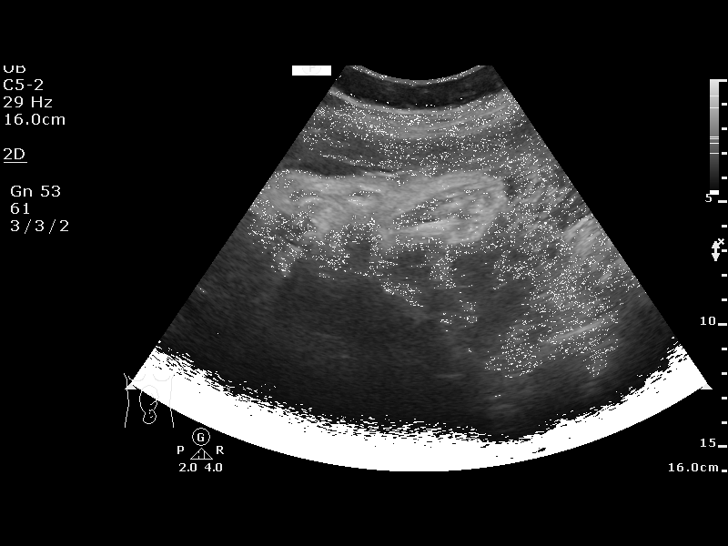
[im 8/13]
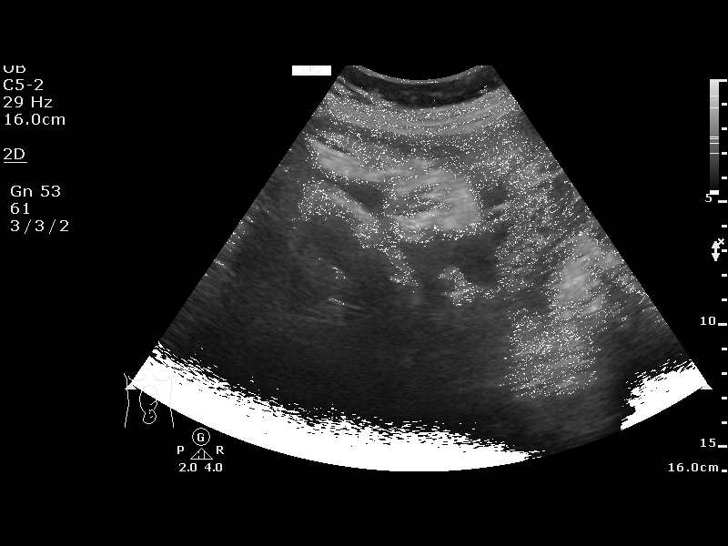
[im 9/13]
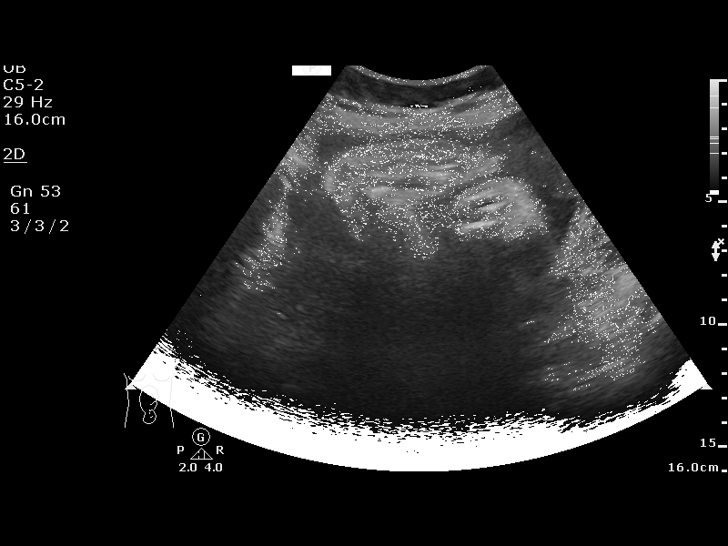
[im 10/13]
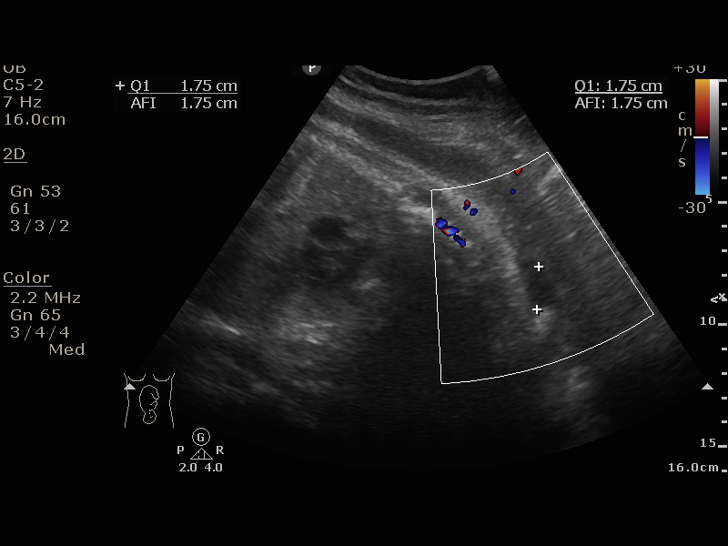
[im 11/13]
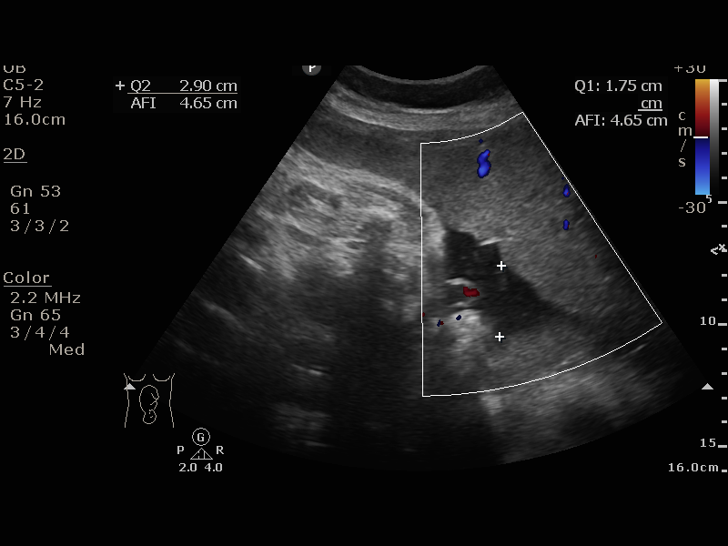
[im 12/13]
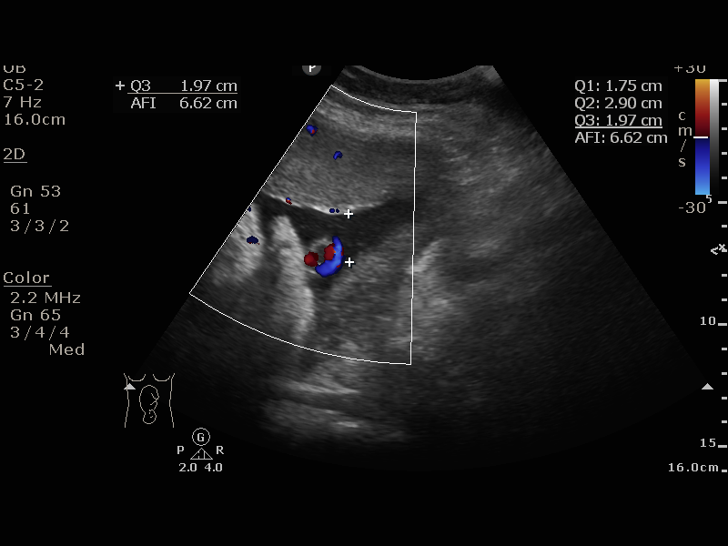
[im 13/13]
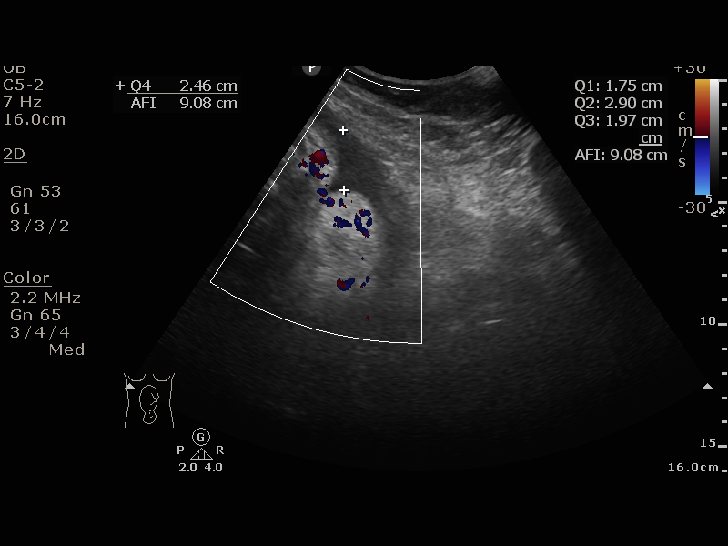

[13 of 13 positions shown; findings below may reference images not displayed]

Women's
[REDACTED]

1  US FETAL BPP W/NONSTRESS                    76818.4

1  DIONTE BANG             [id number]      [id number]     [id number]
Service(s) Provided

Indications

36 weeks gestation of pregnancy
Advanced maternal age multigravida 35+,
third trimester
OB History

Gravidity:    5         Term:   3        Prem:   1        SAB:   0
TOP:          0       Ectopic:  0        Living: 4
Fetal Evaluation

Num Of Fetuses:     1
Preg. Location:     Intrauterine
Cardiac Activity:   Observed
Presentation:       Cephalic

Amniotic Fluid
AFI FV:      Subjectively within normal limits

AFI Sum(cm)     %Tile       Largest Pocket(cm)
9.08            16

RUQ(cm)       RLQ(cm)       LUQ(cm)        LLQ(cm)
1.75
Biophysical Evaluation

Amniotic F.V:   Within normal limits       F. Tone:        Observed
F. Movement:    Observed                   N.S.T:          Reactive
F. Breathing:   Observed                   Score:          [DATE]
Gestational Age

LMP:           38w 3d        Date:  05/02/16                 EDD:   02/06/17
Best:          36w 3d     Det. By:  U/S  (11/17/16)          EDD:   02/20/17
Impression

Recommendations

Continue weekly BPP

## 2020-12-10 ENCOUNTER — Encounter (HOSPITAL_COMMUNITY): Payer: Self-pay

## 2020-12-10 ENCOUNTER — Emergency Department (HOSPITAL_COMMUNITY): Payer: Self-pay

## 2020-12-10 ENCOUNTER — Other Ambulatory Visit: Payer: Self-pay

## 2020-12-10 ENCOUNTER — Emergency Department (HOSPITAL_COMMUNITY)
Admission: EM | Admit: 2020-12-10 | Discharge: 2020-12-10 | Disposition: A | Discharge status: Home / Self Care | Payer: Self-pay | Attending: Emergency Medicine | Admitting: Emergency Medicine

## 2020-12-10 DIAGNOSIS — N201 Calculus of ureter: Secondary | ICD-10-CM | POA: Insufficient documentation

## 2020-12-10 DIAGNOSIS — E876 Hypokalemia: Secondary | ICD-10-CM | POA: Insufficient documentation

## 2020-12-10 DIAGNOSIS — N9489 Other specified conditions associated with female genital organs and menstrual cycle: Secondary | ICD-10-CM | POA: Insufficient documentation

## 2020-12-10 DIAGNOSIS — Z20822 Contact with and (suspected) exposure to covid-19: Secondary | ICD-10-CM | POA: Insufficient documentation

## 2020-12-10 LAB — URINALYSIS, ROUTINE W REFLEX MICROSCOPIC
Bilirubin Urine: NEGATIVE
Glucose, UA: NEGATIVE mg/dL
Ketones, ur: NEGATIVE mg/dL
Leukocytes,Ua: NEGATIVE
Nitrite: NEGATIVE
Protein, ur: NEGATIVE mg/dL
RBC / HPF: >50 RBC/hpf — ABNORMAL HIGH (ref 0–5)
Specific Gravity, Urine: 1.015 (ref 1.005–1.030)
pH: 7 (ref 5.0–8.0)

## 2020-12-10 LAB — CBC WITH DIFFERENTIAL/PLATELET
Abs Immature Granulocytes: 0.03 10*3/uL (ref 0.00–0.07)
Basophils Absolute: 0 10*3/uL (ref 0.0–0.1)
Basophils Relative: 0 %
Eosinophils Absolute: 0.1 10*3/uL (ref 0.0–0.5)
Eosinophils Relative: 1 %
HCT: 39.1 % (ref 36.0–46.0)
Hemoglobin: 13.1 g/dL (ref 12.0–15.0)
Immature Granulocytes: 0 %
Lymphocytes Relative: 21 %
Lymphs Abs: 2 10*3/uL (ref 0.7–4.0)
MCH: 28.1 pg (ref 26.0–34.0)
MCHC: 33.5 g/dL (ref 30.0–36.0)
MCV: 83.9 fL (ref 80.0–100.0)
Monocytes Absolute: 0.6 10*3/uL (ref 0.1–1.0)
Monocytes Relative: 6 %
Neutro Abs: 6.8 10*3/uL (ref 1.7–7.7)
Neutrophils Relative %: 72 %
Platelets: 244 10*3/uL (ref 150–400)
RBC: 4.66 MIL/uL (ref 3.87–5.11)
RDW: 13.2 % (ref 11.5–15.5)
WBC: 9.5 10*3/uL (ref 4.0–10.5)
nRBC: 0 % (ref 0.0–0.2)

## 2020-12-10 LAB — BASIC METABOLIC PANEL
Anion gap: 10 (ref 5–15)
BUN: 15 mg/dL (ref 6–20)
CO2: 24 mmol/L (ref 22–32)
Calcium: 8.9 mg/dL (ref 8.9–10.3)
Chloride: 103 mmol/L (ref 98–111)
Creatinine, Ser: 1.08 mg/dL — ABNORMAL HIGH (ref 0.44–1.00)
GFR, Estimated: >60 mL/min (ref >60)
Glucose, Bld: 129 mg/dL — ABNORMAL HIGH (ref 70–99)
Potassium: 2.9 mmol/L — ABNORMAL LOW (ref 3.5–5.1)
Sodium: 137 mmol/L (ref 135–145)

## 2020-12-10 LAB — I-STAT BETA HCG BLOOD, ED (MC, WL, AP ONLY): I-stat hCG, quantitative: <5 m[IU]/mL (ref <5)

## 2020-12-10 LAB — RESP PANEL BY RT-PCR (FLU A&B, COVID) ARPGX2
Influenza A by PCR: NEGATIVE
Influenza B by PCR: NEGATIVE
SARS Coronavirus 2 by RT PCR: NEGATIVE

## 2020-12-10 MED ORDER — ONDANSETRON 4 MG PO TBDP: 4.0000 mg | ORAL_TABLET | Freq: Three times a day (TID) | ORAL | 0 refills | Status: AC | PRN

## 2020-12-10 MED ORDER — POTASSIUM CHLORIDE ER 10 MEQ PO TBCR: 10.0000 meq | EXTENDED_RELEASE_TABLET | Freq: Every day | ORAL | 0 refills | Status: AC

## 2020-12-10 MED ORDER — ONDANSETRON 4 MG PO TBDP
4.0000 mg | ORAL_TABLET | Freq: Once | ORAL | Status: AC
  Administered 2020-12-10: 4 mg via ORAL
  Filled 2020-12-10: qty 1

## 2020-12-10 MED ORDER — HYDROCODONE-ACETAMINOPHEN 5-325 MG PO TABS: 1.0000 | ORAL_TABLET | ORAL | 0 refills | Status: AC | PRN

## 2020-12-10 MED ORDER — POTASSIUM CHLORIDE CRYS ER 20 MEQ PO TBCR
40.0000 meq | EXTENDED_RELEASE_TABLET | Freq: Once | ORAL | Status: AC
  Administered 2020-12-10: 40 meq via ORAL
  Filled 2020-12-10: qty 2

## 2020-12-10 MED ORDER — HYDROCODONE-ACETAMINOPHEN 5-325 MG PO TABS
2.0000 | ORAL_TABLET | Freq: Once | ORAL | Status: AC
  Administered 2020-12-10: 2 via ORAL
  Filled 2020-12-10: qty 2

## 2020-12-10 NOTE — Discharge Instructions (Addendum)
You have a large kidney stone in your right ureter- the tube that connects the kidney to the bladder. You will need to see a urologist- the urology clinic will call you to schedule an appointment.  Also given referral to [facility], please call and schedule an appointment for routine care as well as assistance with specialty care.  Take Norco as needed as prescribed for pain. Take Zofran as needed as prescribed for nausea and vomiting. Return to the ER for fevers, pain or vomiting not controlled with medications.  Your potassium was low in the ER today. A few potassium pills were sent to your pharmacy to take for the next few days.    Tiene un clculo renal grande en el urter derecho, el tubo que conecta el rin con la vejiga. Deber ver a un urlogo; la clnica de urologa lo llamar para programar una cita.  Tambin si se le remite a [employer] [facility], llame y programe una cita para recibir atencin de rutina y asistencia con atencin especializada.  Tome Norco segn sea necesario segn lo prescrito para Chief Technology Officer. Tome Zofran segn sea necesario segn lo prescrito para las nuseas y los vmitos. Regrese a la sala de emergencias por fiebre, Engineer, mining o vmitos no controlados con medicamentos.  Su potasio estaba bajo en la sala de emergencias hoy. Se enviaron algunas pastillas de potasio a su farmacia para que las tome [employer] [address]

## 2020-12-10 NOTE — ED Notes (Signed)
Pt transported to CT as soon as arrived to room

## 2020-12-10 NOTE — ED Triage Notes (Signed)
LLQ pain radiating to left flank started last night with nausea. Denies any urinary symptoms.

## 2020-12-10 NOTE — ED Provider Notes (Signed)
[facility]   CSN: [id number] Arrival date & time: 12/10/20  [id number]     History Chief Complaint  Patient presents with   Abdominal Pain    Victoria Anthony is a 45 y.o. female.  45 year old female with history of hyperlipidemia, presents with complaint of left flank pain onset last night around 9:00, severe at 4:00 this morning with 1 episode of emesis.  Pain is in the left flank, radiates to left lower quadrant.  Not associated with hematuria, dysuria, frequency, no changes in bowel habits, denies fevers or chills.  States that she has had an ache in her back from time to time that she takes ibuprofen for otherwise no history of kidney stones or similar pain previously.  A language interpreter was used (spanish).  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/2016   Previous cesarean delivery affecting pregnancy 08/08/2016   Language barrier affecting health care 08/08/2016   Lack of education 08/04/2016   Supervision of high risk pregnancy, antepartum 08/04/2016   AMA (advanced maternal age) multigravida 35+ 08/04/2016   Preexisting diabetes complicating pregnancy, antepartum 08/04/2016    Past Surgical History:  Procedure Laterality Date   CESAREAN SECTION     X3   CESAREAN SECTION  03/23/2012   Procedure: CESAREAN SECTION;  Surgeon: Tilda BurrowJohn V Ferguson, MD;  Location: [facility];  Service: Obstetrics;  Laterality: N/A;   CESAREAN SECTION N/A 01/30/2017   Procedure: REPEAT CESAREAN SECTION;  Surgeon: Adam PhenixArnold, James G, MD;  [facility];  Service: Obstetrics;  Laterality: N/A;   CHOLECYSTECTOMY  ?   NO PAST SURGERIES       OB History     Gravida  5   Para  5   Term  4   Preterm  1   AB      Living  4      SAB      IAB      Ectopic      Multiple  0   Live Births  4           Family History  Problem Relation Age of Onset   Stomach cancer Other    Heart disease Mother    Stomach cancer Father     Social History   Tobacco Use   Smoking status: Never   Smokeless tobacco: Never  Substance Use Topics   Alcohol use: No   Drug use: No    Home Medications Prior to Admission medications   Medication Sig Start Date End Date Taking? Authorizing Provider  HYDROcodone-acetaminophen (NORCO/VICODIN) 5-325 MG tablet Take 1 tablet by mouth every 4 (four) hours as needed. 12/10/20  Yes Jeannie FendMurphy, Demara A, PA-C  ibuprofen (ADVIL) 200 MG tablet Take 200 mg by mouth every 6 (six) hours as needed for headache or moderate pain.  Yes [provider]  ondansetron (ZOFRAN ODT) 4 MG disintegrating tablet Take 1 tablet (4 mg total) by mouth every 8 (eight) hours as needed for nausea or vomiting. 12/10/20  Yes Jeannie Fend, PA-C  potassium chloride (KLOR-CON) 10 MEQ tablet Take 1 tablet (10 mEq total) by mouth daily for 5 days. 12/10/20 12/15/20 Yes Jeannie Fend, PA-C  ibuprofen (ADVIL,MOTRIN) 600 MG tablet Take 1 tablet (600 mg total) by mouth every 6 (six) hours. Patient not taking: No sig reported 02/01/17   Degele, Kandra Nicolas, MD  oxyCODONE (OXY IR/ROXICODONE) 5 MG immediate release tablet Take 1 tablet (5 mg total) by mouth every 6 (six) hours as needed for severe pain. Patient not taking: No sig reported 02/01/17   Degele, Kandra Nicolas, MD    Allergies    Patient has no known allergies.  Review of Systems   Review of Systems   Constitutional:  Negative for chills, diaphoresis and fever.  Respiratory:  Negative for shortness of breath.   Cardiovascular:  Negative for chest pain.  Gastrointestinal:  Positive for abdominal pain, nausea and vomiting. Negative for constipation and diarrhea.  Genitourinary:  Positive for flank pain. Negative for dysuria, hematuria and urgency.  Musculoskeletal:  Positive for back pain. Negative for arthralgias and myalgias.  Skin:  Negative for rash and wound.  Allergic/Immunologic: Negative for immunocompromised state.  Neurological:  Negative for weakness.  Hematological:  Negative for adenopathy.  All other systems reviewed and are negative.  Physical Exam Updated Vital Signs BP 123/67   Pulse 69   Temp 98 F (36.7 C) (Temporal)   Resp 16   Ht 5\' 1"  (1.549 m)   Wt 61.7 kg   SpO2 98%   BMI 25.70 kg/m   Physical Exam Vitals and nursing note reviewed.  Constitutional:      General: She is not in acute distress.    Appearance: She is well-developed. She is obese. She is not diaphoretic.  HENT:     Head: Normocephalic and atraumatic.  Cardiovascular:     Rate and Rhythm: Normal rate and regular rhythm.     Heart sounds: Normal heart sounds.  Pulmonary:     Effort: Pulmonary effort is normal.  Abdominal:     Palpations: Abdomen is soft.     Tenderness: There is no abdominal tenderness. There is no right CVA tenderness or left CVA tenderness.  Skin:    General: Skin is warm and dry.     Findings: No erythema or rash.  Neurological:     Mental Status: She is alert and oriented to person, place, and time.  Psychiatric:        Behavior: Behavior normal.    ED Results / Procedures / Treatments   Labs (all labs ordered are listed, but only abnormal results are displayed) Labs Reviewed  BASIC METABOLIC PANEL - Abnormal; Notable for the following components:      Result Value   Potassium 2.9 (*)    Glucose, Bld 129 (*)    Creatinine, Ser 1.08 (*)    All other  components within normal limits  URINALYSIS, ROUTINE W REFLEX MICROSCOPIC - Abnormal; Notable for the following components:   APPearance HAZY (*)    Hgb urine dipstick MODERATE (*)    RBC / HPF >50 (*)    Bacteria, UA RARE (*)    All other components within normal limits  RESP PANEL BY RT-PCR (FLU A&B, COVID) ARPGX2  CBC WITH DIFFERENTIAL/PLATELET  I-STAT BETA HCG BLOOD,  ED ([facility], [facility], [facility] ONLY)    EKG None  Radiology CT Renal Stone Study  Result Date: 12/10/2020 CLINICAL DATA:  Flank pain.  Kidney stones suspected. EXAM: CT ABDOMEN AND PELVIS WITHOUT CONTRAST TECHNIQUE: Multidetector CT imaging of the abdomen and pelvis was performed following the standard protocol without IV contrast. COMPARISON:  None. FINDINGS: Lower chest: Unremarkable. Hepatobiliary: No focal abnormality in the liver on this study without intravenous contrast. Gallbladder is surgically absent. No intrahepatic or extrahepatic biliary dilation. Pancreas: No focal mass lesion. No dilatation of the main duct. No intraparenchymal cyst. No peripancreatic edema. Spleen: No splenomegaly. No focal mass lesion. Adrenals/Urinary Tract: No adrenal nodule or mass. Imaging through the kidneys is motion degraded. A 2-3 mm nonobstructing stone is noted upper pole right kidney with no right ureteral stone evident. No secondary changes in the right kidney or ureter. Left kidney is edematous with mild left hydronephrosis. Somewhat unusual appearing linear stone is identified in the proximal left ureter measuring 4 x 4 x 15 mm. No other left ureteral stone. No bladder stones. Stomach/Bowel: Stomach is unremarkable. No gastric wall thickening. No evidence of outlet obstruction. Duodenum is normally positioned as is the ligament of Treitz. No small bowel wall thickening. No small bowel dilatation. The terminal ileum is normal. The appendix is normal. No gross colonic mass. No colonic wall thickening. Vascular/Lymphatic: No abdominal aortic aneurysm.  There is no gastrohepatic or hepatoduodenal ligament lymphadenopathy. No retroperitoneal or mesenteric lymphadenopathy. No pelvic sidewall lymphadenopathy. Reproductive: The uterus is unremarkable.  There is no adnexal mass. Other: No intraperitoneal free fluid. Musculoskeletal: No worrisome lytic or sclerotic osseous abnormality. IMPRESSION: 1. 4 x 4 x 15 mm unusual appearing linear stone in the proximal left ureter with mild left hydronephrosis. Left kidney is edematous with mild perinephric edema. 2. 2-3 mm nonobstructing stone upper pole right kidney. Electronically Signed   By: Kennith Center M.D.   On: 12/10/2020 06:05    Procedures Procedures   Medications Ordered in ED Medications  ondansetron (ZOFRAN-ODT) disintegrating tablet 4 mg (4 mg Oral Given 12/10/20 0516)  HYDROcodone-acetaminophen (NORCO/VICODIN) 5-325 MG per tablet 2 tablet (2 tablets Oral Given 12/10/20 0516)  potassium chloride SA (KLOR-CON) CR tablet 40 mEq (40 mEq Oral Given 12/10/20 0350)    ED Course  I have reviewed the triage vital signs and the nursing notes.  Pertinent labs & imaging results that were available during my care of the patient were reviewed by me and considered in my medical decision making (see chart for details).  Clinical Course as of 12/10/20 0818  Thu Dec 10, 2020  [id number] 45 year old female with complaint of left flank pain onset last night, worse this morning with 1 episode of vomiting.  Patient was given Vicodin and Zofran in triage, pain has since resolved.  On exam, no CVA tenderness, abdomen soft and nontender.  Patient is well-appearing.  CBC is unremarkable, BMP with hypokalemia with potassium of 2.9, given oral dose and will discharge with additional short course.  hCG negative.  Urinalysis positive for hemoglobin, no evidence of UTI.  Patient is COVID and flu negative.  CT shows a large proximal left stone measuring 4 x 4 x 15 mm. Case discussed with Dr. Cardell Peach on-call with urology, plan is for  resident clinic to contact patient.  We will also refer to [facility] health and wellness as she does not have a primary care or insurance to assist with follow-ups. Given prescription for Norco and Zofran.  Return precautions including fever,  worsening pain or pain or vomiting not controlled with medications.  Translator used for history, physical, discharge planning. [LM]    Clinical Course User Index [LM] Alden Hipp   MDM Rules/Calculators/A&P                           Final Clinical Impression(s) / ED Diagnoses Final diagnoses:  Left ureteral stone  Hypokalemia    Rx / DC Orders ED Discharge Orders          Ordered    potassium chloride (KLOR-CON) 10 MEQ tablet  Daily        12/10/20 0751    HYDROcodone-acetaminophen (NORCO/VICODIN) 5-325 MG tablet  Every 4 hours PRN        12/10/20 0751    ondansetron (ZOFRAN ODT) 4 MG disintegrating tablet  Every 8 hours PRN        12/10/20 0751             Jeannie Fend, PA-C 12/10/20 0818    Terald Sleeper, MD 12/10/20 1118

## 2020-12-10 NOTE — ED Provider Notes (Signed)
Emergency Medicine Provider Triage Evaluation Note  Victoria Anthony , a 45 y.o. female  was evaluated in triage.  Pt complains of left sided flank pain that radiates to the groin.  Onset last night around 9pm.  Associated nausea.  Denies dysuria.    Review of Systems  Positive: Flank pain, abdominal pain Negative: Dysuria, vaginal discharge  Physical Exam  BP (!) 172/99 (BP Location: Right Arm)   Pulse 95   Temp 99.7 F (37.6 C) (Oral)   Resp 20   Ht 5\' 1"  (1.549 m)   Wt 61.7 kg   SpO2 98%   BMI 25.70 kg/m  Gen:   Awake, no distress   Resp:  Normal effort  MSK:   Moves extremities without difficulty  Other:    Medical Decision Making  Medically screening exam initiated at 5:12 AM.  Appropriate orders placed.  Tyneisha Hegeman was informed that the remainder of the evaluation will be completed by another provider, this initial triage assessment does not replace that evaluation, and the importance of remaining in the ED until their evaluation is complete.  Flank pain   Tomie China, PA-C 12/10/20 02/09/21    Palumbo, April, MD 12/10/20 [phone]

## 2023-04-12 IMAGING — CT CT RENAL STONE PROTOCOL
2 of 4 series · 16 of 46 positions shown, 18 images · non-contrast
Comparison: None.

CLINICAL DATA: Flank pain.  Kidney stones suspected.

EXAM:
CT ABDOMEN AND PELVIS WITHOUT CONTRAST
TECHNIQUE: Multidetector CT imaging of the abdomen and pelvis was performed
following the standard protocol without IV contrast.

[Series 3: renal stone 5.0 · axial · 0.88mm/px · z∈[-985,-565]mm · 13 of 92 slices shown, 15 images]
[im 4/92  soft-tissue]
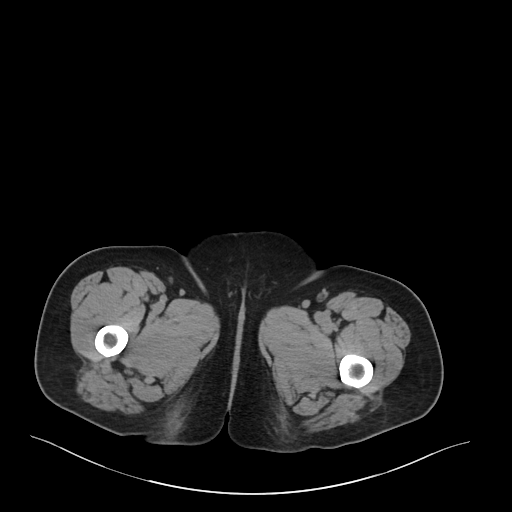
[im 4/92  bone]
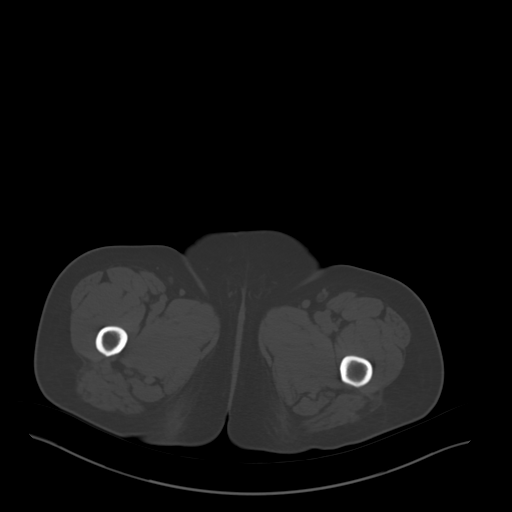
[im 11/92  soft-tissue]
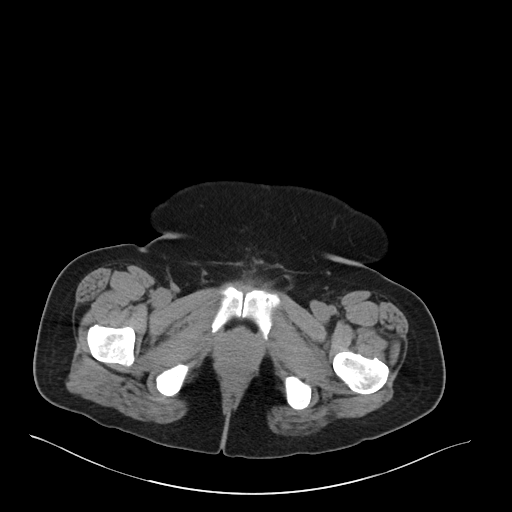
[im 19/92  soft-tissue]
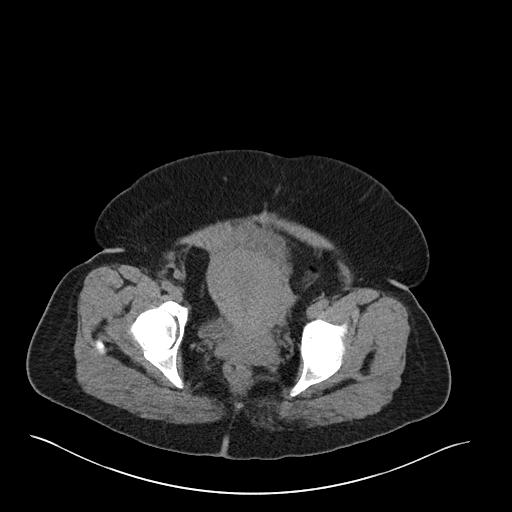
[im 26/92  soft-tissue]
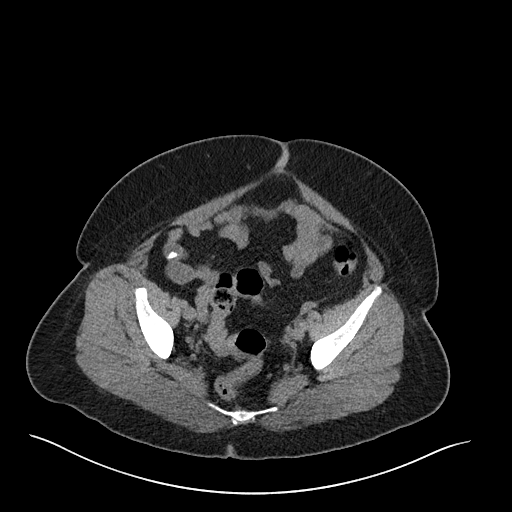
[im 33/92  soft-tissue]
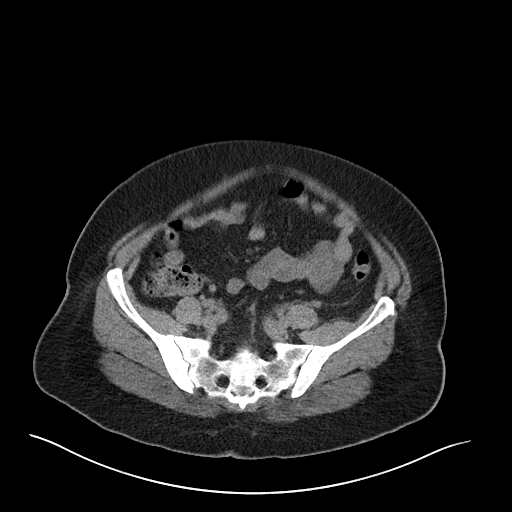
[im 41/92  soft-tissue]
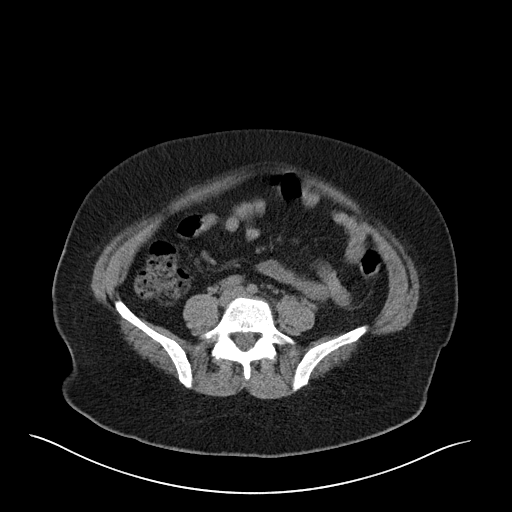
[im 48/92  soft-tissue]
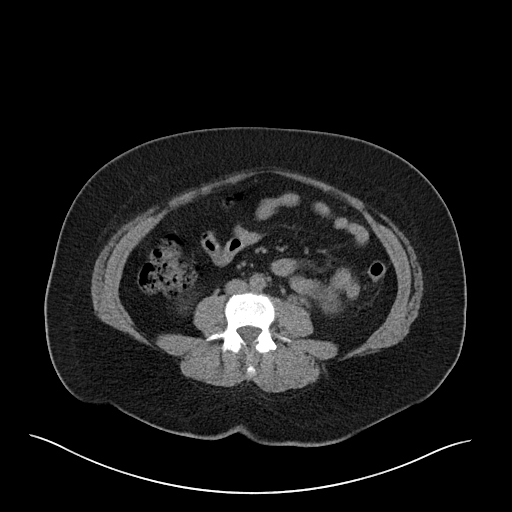
[im 51/92  soft-tissue]
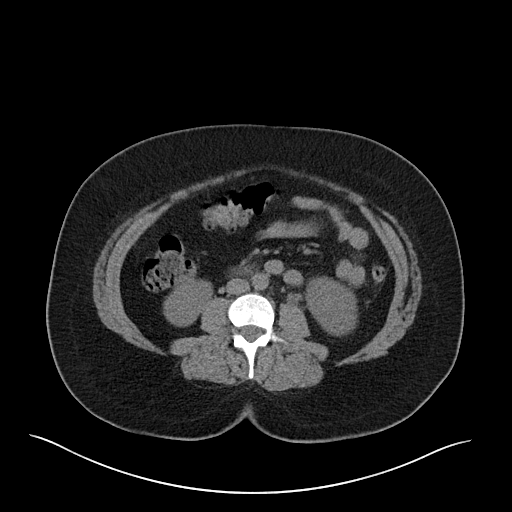
[im 59/92  soft-tissue]
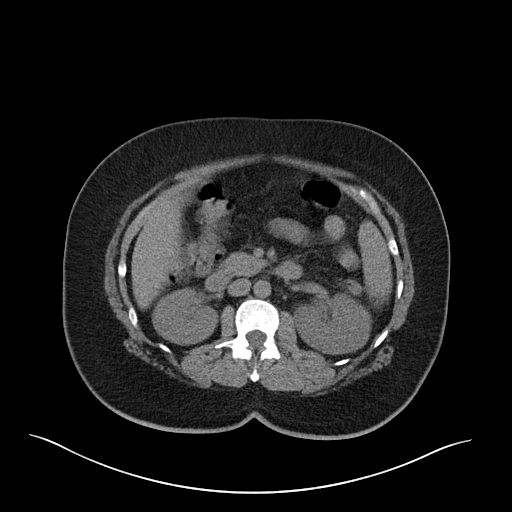
[im 59/92  bone]
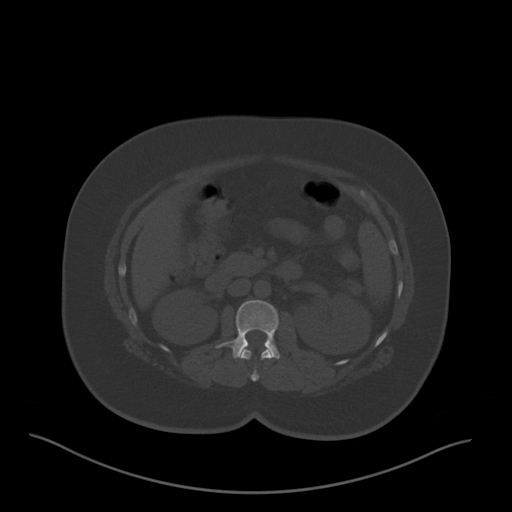
[im 66/92  soft-tissue]
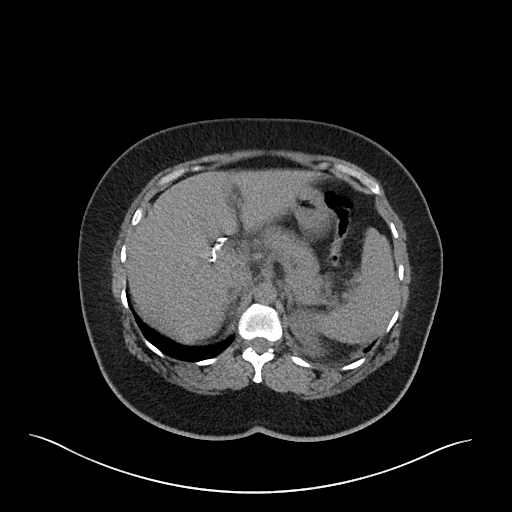
[im 73/92  soft-tissue]
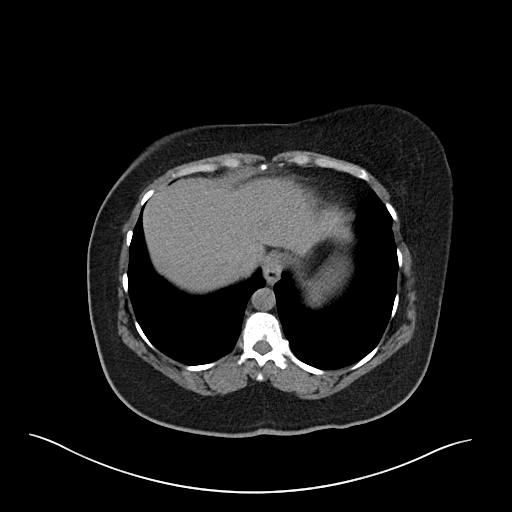
[im 81/92  soft-tissue]
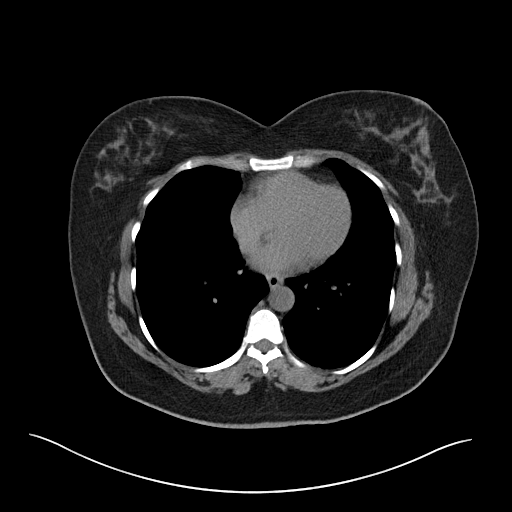
[im 88/92  soft-tissue]
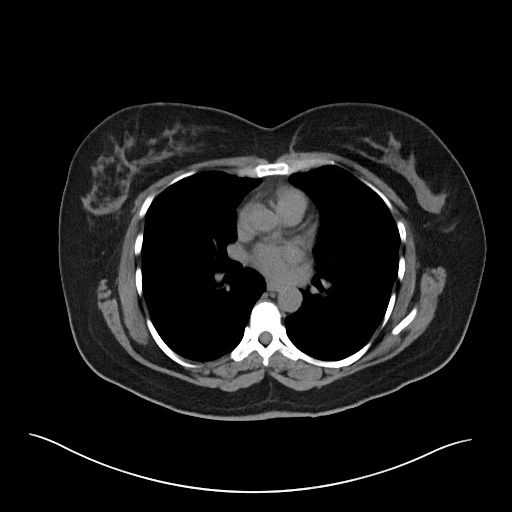

[Series 6: cor · coronal · 0.72mm/px · 3 of 119 slices shown]
[im 40/119  soft-tissue]
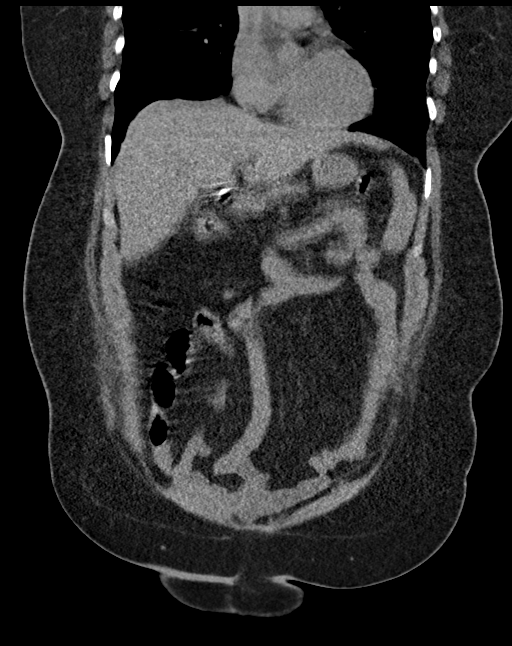
[im 53/119  soft-tissue]
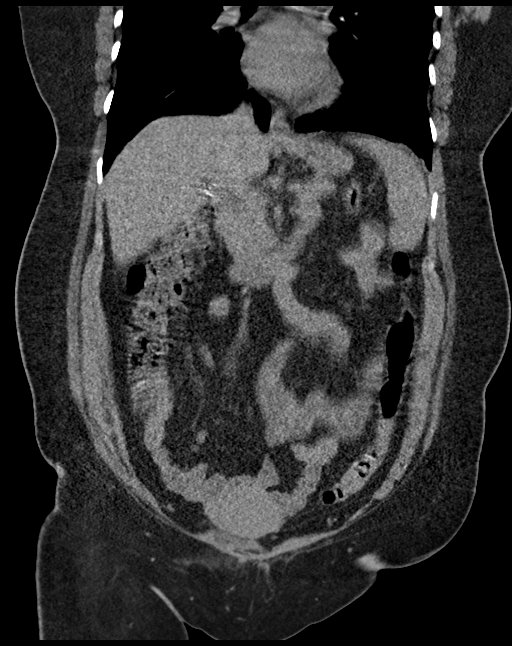
[im 66/119  soft-tissue]
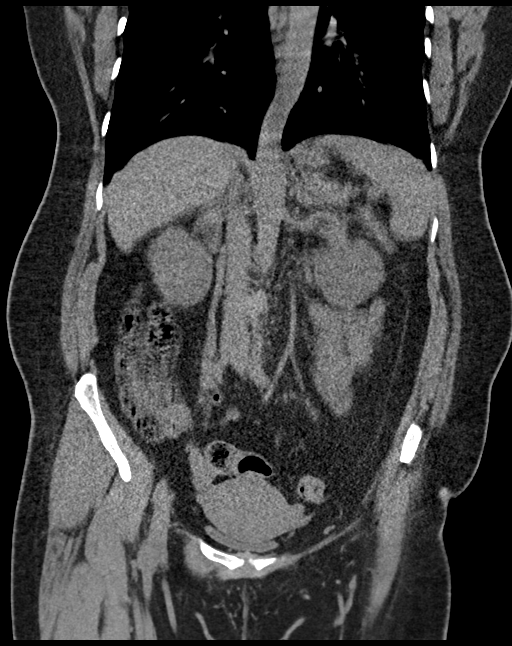

[16 of 46 positions shown; findings below may reference images not displayed]

FINDINGS: Lower chest: Unremarkable.

Hepatobiliary: No focal abnormality in the liver on this study
without intravenous contrast. Gallbladder is surgically absent. No
intrahepatic or extrahepatic biliary dilation.

Pancreas: No focal mass lesion. No dilatation of the main duct. No
intraparenchymal cyst. No peripancreatic edema.

Spleen: No splenomegaly. No focal mass lesion.

Adrenals/Urinary Tract: No adrenal nodule or mass. Imaging through
the kidneys is motion degraded. A 2-3 mm nonobstructing stone is
noted upper pole right kidney with no right ureteral stone evident.
No secondary changes in the right kidney or ureter.

Left kidney is edematous with mild left hydronephrosis. Somewhat
unusual appearing linear stone is identified in the proximal left
ureter measuring 4 x 4 x 15 mm. No other left ureteral stone.

No bladder stones.

Stomach/Bowel: Stomach is unremarkable. No gastric wall thickening.
No evidence of outlet obstruction. Duodenum is normally positioned
as is the ligament of Treitz. No small bowel wall thickening. No
small bowel dilatation. The terminal ileum is normal. The appendix
is normal. No gross colonic mass. No colonic wall thickening.

Vascular/Lymphatic: No abdominal aortic aneurysm. There is no
gastrohepatic or hepatoduodenal ligament lymphadenopathy. No
retroperitoneal or mesenteric lymphadenopathy. No pelvic sidewall
lymphadenopathy.

Reproductive: The uterus is unremarkable.  There is no adnexal mass.

Other: No intraperitoneal free fluid.

Musculoskeletal: No worrisome lytic or sclerotic osseous
abnormality.
IMPRESSION: 1. 4 x 4 x 15 mm unusual appearing linear stone in the proximal left
ureter with mild left hydronephrosis. Left kidney is edematous with
mild perinephric edema.
2. 2-3 mm nonobstructing stone upper pole right kidney.
